# Patient Record
Sex: Female | Born: 1950 | Race: White | Hispanic: No | Smoking: Current every day smoker
Health system: Southern US, Community
[De-identification: ages and names within clinical notes are randomized; demographics above are authoritative.]

## PROBLEM LIST (undated history)

## (undated) DIAGNOSIS — R42 Dizziness and giddiness: Secondary | ICD-10-CM

## (undated) DIAGNOSIS — I714 Abdominal aortic aneurysm, without rupture, unspecified: Secondary | ICD-10-CM

## (undated) DIAGNOSIS — F419 Anxiety disorder, unspecified: Secondary | ICD-10-CM

## (undated) DIAGNOSIS — E785 Hyperlipidemia, unspecified: Secondary | ICD-10-CM

## (undated) DIAGNOSIS — I10 Essential (primary) hypertension: Secondary | ICD-10-CM

## (undated) HISTORY — DX: Essential (primary) hypertension: I10

## (undated) HISTORY — DX: Anxiety disorder, unspecified: F41.9

## (undated) HISTORY — DX: Abdominal aortic aneurysm, without rupture: I71.4

## (undated) HISTORY — DX: Abdominal aortic aneurysm, without rupture, unspecified: I71.40

## (undated) HISTORY — DX: Dizziness and giddiness: R42

## (undated) HISTORY — DX: Hyperlipidemia, unspecified: E78.5

---

## 2000-01-12 ENCOUNTER — Other Ambulatory Visit: Admission: RE | Admit: 2000-01-12 | Discharge: 2000-01-12 | Payer: Self-pay | Admitting: Gynecology

## 2000-01-14 ENCOUNTER — Encounter: Admission: RE | Admit: 2000-01-14 | Discharge: 2000-01-14 | Payer: Self-pay | Admitting: Gynecology

## 2000-01-14 ENCOUNTER — Encounter: Payer: Self-pay | Admitting: Gynecology

## 2000-01-16 ENCOUNTER — Other Ambulatory Visit: Admission: RE | Admit: 2000-01-16 | Discharge: 2000-01-16 | Payer: Self-pay | Admitting: Gynecology

## 2000-01-16 ENCOUNTER — Encounter (INDEPENDENT_AMBULATORY_CARE_PROVIDER_SITE_OTHER): Payer: Self-pay

## 2006-07-15 ENCOUNTER — Other Ambulatory Visit: Admission: RE | Admit: 2006-07-15 | Discharge: 2006-07-15 | Payer: Self-pay | Admitting: Gynecology

## 2006-07-19 ENCOUNTER — Encounter: Admission: RE | Admit: 2006-07-19 | Discharge: 2006-07-19 | Payer: Self-pay | Admitting: Gynecology

## 2007-08-16 ENCOUNTER — Other Ambulatory Visit: Admission: RE | Admit: 2007-08-16 | Discharge: 2007-08-16 | Payer: Self-pay | Admitting: Gynecology

## 2007-08-23 ENCOUNTER — Encounter: Admission: RE | Admit: 2007-08-23 | Discharge: 2007-08-23 | Payer: Self-pay | Admitting: Gynecology

## 2008-08-16 ENCOUNTER — Other Ambulatory Visit: Admission: RE | Admit: 2008-08-16 | Discharge: 2008-08-16 | Payer: Self-pay | Admitting: Gynecology

## 2008-08-23 ENCOUNTER — Encounter: Admission: RE | Admit: 2008-08-23 | Discharge: 2008-08-23 | Payer: Self-pay | Admitting: Gynecology

## 2009-11-12 ENCOUNTER — Encounter: Admission: RE | Admit: 2009-11-12 | Discharge: 2009-11-12 | Payer: Self-pay | Admitting: Gynecology

## 2009-11-19 ENCOUNTER — Encounter: Admission: RE | Admit: 2009-11-19 | Discharge: 2009-11-19 | Payer: Self-pay | Admitting: Gynecology

## 2010-12-28 ENCOUNTER — Encounter: Payer: Self-pay | Admitting: Gynecology

## 2012-02-01 ENCOUNTER — Other Ambulatory Visit: Payer: Self-pay | Admitting: Gynecology

## 2012-02-01 DIAGNOSIS — R928 Other abnormal and inconclusive findings on diagnostic imaging of breast: Secondary | ICD-10-CM

## 2012-03-08 DIAGNOSIS — E785 Hyperlipidemia, unspecified: Secondary | ICD-10-CM | POA: Insufficient documentation

## 2012-03-18 ENCOUNTER — Ambulatory Visit
Admission: RE | Admit: 2012-03-18 | Discharge: 2012-03-18 | Disposition: A | Discharge status: Home / Self Care | Payer: BC Managed Care – PPO | Source: Ambulatory Visit | Attending: Gynecology | Admitting: Gynecology

## 2012-03-18 DIAGNOSIS — R928 Other abnormal and inconclusive findings on diagnostic imaging of breast: Secondary | ICD-10-CM

## 2014-03-07 DIAGNOSIS — R9431 Abnormal electrocardiogram [ECG] [EKG]: Secondary | ICD-10-CM | POA: Insufficient documentation

## 2014-05-01 DIAGNOSIS — D75839 Thrombocytosis, unspecified: Secondary | ICD-10-CM | POA: Insufficient documentation

## 2016-09-02 DIAGNOSIS — K648 Other hemorrhoids: Secondary | ICD-10-CM | POA: Insufficient documentation

## 2020-01-03 ENCOUNTER — Other Ambulatory Visit: Payer: Self-pay | Admitting: Obstetrics and Gynecology

## 2020-01-03 DIAGNOSIS — Z720 Tobacco use: Secondary | ICD-10-CM

## 2020-01-11 ENCOUNTER — Ambulatory Visit
Admission: RE | Admit: 2020-01-11 | Discharge: 2020-01-11 | Disposition: A | Discharge status: Home / Self Care | Payer: Medicare Other | Source: Ambulatory Visit | Attending: Obstetrics and Gynecology | Admitting: Obstetrics and Gynecology

## 2020-01-11 DIAGNOSIS — Z720 Tobacco use: Secondary | ICD-10-CM

## 2020-02-14 ENCOUNTER — Other Ambulatory Visit: Payer: Self-pay | Admitting: Obstetrics and Gynecology

## 2020-02-14 DIAGNOSIS — N281 Cyst of kidney, acquired: Secondary | ICD-10-CM

## 2020-03-11 ENCOUNTER — Ambulatory Visit
Admission: RE | Admit: 2020-03-11 | Discharge: 2020-03-11 | Disposition: A | Discharge status: Home / Self Care | Payer: Medicare Other | Source: Ambulatory Visit | Attending: Obstetrics and Gynecology | Admitting: Obstetrics and Gynecology

## 2020-03-11 DIAGNOSIS — N281 Cyst of kidney, acquired: Secondary | ICD-10-CM

## 2020-03-11 MED ORDER — GADOBENATE DIMEGLUMINE 529 MG/ML IV SOLN
11.0000 mL | Freq: Once | INTRAVENOUS | Status: AC | PRN
  Administered 2020-03-11: 17:00:00 11 mL via INTRAVENOUS

## 2020-04-12 ENCOUNTER — Other Ambulatory Visit: Payer: Self-pay | Admitting: *Deleted

## 2020-04-12 DIAGNOSIS — I714 Abdominal aortic aneurysm, without rupture, unspecified: Secondary | ICD-10-CM

## 2020-04-16 ENCOUNTER — Encounter: Payer: Self-pay | Admitting: *Deleted

## 2020-04-16 ENCOUNTER — Ambulatory Visit: Payer: Medicare Other | Admitting: Vascular Surgery

## 2020-04-16 ENCOUNTER — Encounter: Payer: Self-pay | Admitting: Vascular Surgery

## 2020-04-16 ENCOUNTER — Other Ambulatory Visit: Payer: Self-pay

## 2020-04-16 ENCOUNTER — Ambulatory Visit (HOSPITAL_COMMUNITY)
Admission: RE | Admit: 2020-04-16 | Discharge: 2020-04-16 | Disposition: A | Discharge status: Home / Self Care | Payer: Medicare Other | Source: Ambulatory Visit | Attending: Vascular Surgery | Admitting: Vascular Surgery

## 2020-04-16 VITALS — BP 117/81 | HR 71 | Temp 98.0°F | Resp 20 | Ht 63.5 in | Wt 119.0 lb

## 2020-04-16 DIAGNOSIS — I714 Abdominal aortic aneurysm, without rupture, unspecified: Secondary | ICD-10-CM

## 2020-04-16 DIAGNOSIS — F17200 Nicotine dependence, unspecified, uncomplicated: Secondary | ICD-10-CM | POA: Insufficient documentation

## 2020-04-16 NOTE — Progress Notes (Signed)
[facility]ist of [place]  Patient name: Alice White MRN: [id number] DOB: 1951-08-21 Sex: female  REASON FOR CONSULT: Evaluation of incidental finding of abdominal aortic aneurysm  HPI: Alice White is a 69 y.o. female, who is here today for discussion of abdominal aortic aneurysm.  She had no prior knowledge of this.  She initially underwent a lung cancer low dose CT scan of her chest.  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  Also had a renal cyst and underwent MRI for further evaluation of the left renal cyst.  I have reviewed her CT scan and her MRI images.  She has no history of cardiac disease and no history of peripheral vascular occlusive disease and no history of stroke  Past Medical History:  Diagnosis Date  . AAA (abdominal aortic aneurysm) (HCC)   . Anxiety   . Hyperlipidemia   . Hypertension   . Vertigo     History reviewed. No pertinent family history.  SOCIAL HISTORY: Social History   Socioeconomic History  . Marital status: Married    Spouse name: Not on file  . Number of children: Not on file  . Years of education: Not on file  . Highest education level: Not on file  Occupational History  . Not on file  Tobacco Use  . Smoking status: Current Every Day Smoker    Packs/day: 0.50    Types: Cigarettes  . Smokeless tobacco: Never Used  Substance and Sexual Activity  . Alcohol use: Not Currently  . Drug use: Never  . Sexual activity: Not on file  Other Topics Concern  . Not on file  Social History Narrative  . Not on file   Social Determinants of Health   Financial Resource Strain:   . Difficulty of Paying Living Expenses:   Food Insecurity:   . Worried About Programme researcher, broadcasting/film/video in the Last Year:   . Barista in the Last Year:   Transportation Needs:   . Freight forwarder (Medical):   Marland Kitchen Lack of Transportation (Non-Medical):   Physical Activity:   . Days of Exercise per  Week:   . Minutes of Exercise per Session:   Stress:   . Feeling of Stress :   Social Connections:   . Frequency of Communication with Friends and Family:   . Frequency of Social Gatherings with Friends and Family:   . Attends Religious Services:   . Active Member of Clubs or Organizations:   . Attends Banker Meetings:   Marland Kitchen Marital Status:   Intimate Partner Violence:   . Fear of Current or Ex-Partner:   . Emotionally Abused:   Marland Kitchen Physically Abused:   . Sexually Abused:     No Known Allergies  Current Outpatient Medications  Medication Sig Dispense Refill  . ALPRAZolam (XANAX) 0.25 MG tablet Take 0.25 mg by mouth daily as needed.    Marland Kitchen amLODipine (NORVASC) 5 MG tablet amlodipine 5 mg tablet    . aspirin 81 MG chewable tablet Chew by mouth.    . Calcium-Magnesium-Vitamin D (CALCIUM 1200+D3 PO)     . fenofibrate 160 MG tablet     . hydrochlorothiazide (HYDRODIURIL) 25 MG tablet Take 25 mg by mouth daily.    . meclizine (ANTIVERT) 50 MG tablet Take by mouth.    . Multiple Vitamins-Minerals (MULTIVITAMIN WITH MINERALS) tablet Take by mouth.    . Omega-3 Fatty Acids (FISH OIL) 1000 MG CAPS Take by  mouth.    . simvastatin (ZOCOR) 20 MG tablet Take 20 mg by mouth at bedtime.     No current facility-administered medications for this visit.    REVIEW OF SYSTEMS:  [X]  denotes positive finding, [ ]  denotes negative finding Cardiac  Comments:  Chest pain or chest pressure:    Shortness of breath upon exertion:    Short of breath when lying flat:    Irregular heart rhythm:        Vascular    Pain in calf, thigh, or hip brought on by ambulation:    Pain in feet at night that wakes you up from your sleep:     Blood clot in your veins:    Leg swelling:         Pulmonary    Oxygen at home:    Productive cough:     Wheezing:         Neurologic    Sudden weakness in arms or legs:     Sudden numbness in arms or legs:     Sudden onset of difficulty speaking or slurred  speech:    Temporary loss of vision in one eye:     Problems with dizziness:         Gastrointestinal    Blood in stool:     Vomited blood:         Genitourinary    Burning when urinating:     Blood in urine:        Psychiatric    Major depression:         Hematologic    Bleeding problems:    Problems with blood clotting too easily:        Skin    Rashes or ulcers:        Constitutional    Fever or chills:      PHYSICAL EXAM: Vitals:   04/16/20 0828  BP: 117/81  Pulse: 71  Resp: 20  Temp: 98 F (36.7 C)  SpO2: 96%  Weight: 119 lb (54 kg)  Height: 5' 3.5" (1.613 m)    GENERAL: The patient is a well-nourished female, in no acute distress. The vital signs are documented above. CARDIOVASCULAR: Carotid arteries are without bruits bilaterally.  She has 2+ radial 2+ femoral 2+ popliteal and 2+ dorsalis pedis pulses with no evidence of peripheral artery aneurysm PULMONARY: There is good air exchange  ABDOMEN: Soft and non-tender.  She does have a prominent aortic pulse MUSCULOSKELETAL: There are no major deformities or cyanosis. NEUROLOGIC: No focal weakness or paresthesias are detected. SKIN: There are no ulcers or rashes noted. PSYCHIATRIC: The patient has a normal affect.  DATA:  Her chest CT and abdominal MRI were reviewed.  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  She also has a saccular component of the aneurysm in her mid abdominal aorta.  MEDICAL ISSUES: I had a long discussion with the patient regarding this.  Explained the significance of her location this is not typical location since it does involve the renal arteries.  Also explained the significance of her saccular aneurysm.  I did discuss leaking aneurysm symptoms and she knows to report immediately to the emergency room should this occur.  We will see her in 6 months with CT angiogram of her abdomen and pelvis.   Rosetta Posner, MD FACS [facility] of  [facility] Tel [phone] Pager [phone]

## 2020-11-14 ENCOUNTER — Other Ambulatory Visit: Payer: Self-pay

## 2020-11-14 DIAGNOSIS — I714 Abdominal aortic aneurysm, without rupture, unspecified: Secondary | ICD-10-CM

## 2020-12-25 ENCOUNTER — Ambulatory Visit
Admission: RE | Admit: 2020-12-25 | Discharge: 2020-12-25 | Disposition: A | Discharge status: Home / Self Care | Payer: Medicare Other | Source: Ambulatory Visit | Attending: Vascular Surgery | Admitting: Vascular Surgery

## 2020-12-25 ENCOUNTER — Other Ambulatory Visit: Payer: Self-pay

## 2020-12-25 DIAGNOSIS — I714 Abdominal aortic aneurysm, without rupture, unspecified: Secondary | ICD-10-CM

## 2020-12-25 MED ORDER — IOPAMIDOL (ISOVUE-370) INJECTION 76%
75.0000 mL | Freq: Once | INTRAVENOUS | Status: AC | PRN
  Administered 2020-12-25: 75 mL via INTRAVENOUS

## 2020-12-31 ENCOUNTER — Ambulatory Visit: Payer: Medicare Other | Admitting: Vascular Surgery

## 2020-12-31 ENCOUNTER — Other Ambulatory Visit: Payer: Self-pay

## 2020-12-31 ENCOUNTER — Encounter: Payer: Self-pay | Admitting: Vascular Surgery

## 2020-12-31 VITALS — BP 125/84 | HR 89 | Temp 98.1°F | Wt 122.0 lb

## 2020-12-31 DIAGNOSIS — I714 Abdominal aortic aneurysm, without rupture, unspecified: Secondary | ICD-10-CM

## 2020-12-31 NOTE — Progress Notes (Signed)
Vascular and Vein Specialist of [place]  Patient name: Alice White MRN: [id number] DOB: Jan 01, 1951 Sex: female  REASON FOR CONSULT: Evaluation of incidental finding of abdominal aortic aneurysm  HPI: Alice White is a 70 y.o. female, who is here today for discussion of abdominal aortic aneurysm.  She had no prior knowledge of this.  She initially underwent a lung cancer low dose CT scan of her chest.  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  Also had a renal cyst and underwent MRI for further evaluation of the left renal cyst.  I have reviewed her CT scan and her MRI images.  She has no history of cardiac disease and no history of peripheral vascular occlusive disease and no history of stroke  12/31/20: Patient returns to clinic with CTA to discuss.  She has no symptoms.  Past Medical History:  Diagnosis Date  . AAA (abdominal aortic aneurysm) (HCC)   . Anxiety   . Hyperlipidemia   . Hypertension   . Vertigo     No family history on file.  SOCIAL HISTORY: Social History   Socioeconomic History  . Marital status: Married    Spouse name: Not on file  . Number of children: Not on file  . Years of education: Not on file  . Highest education level: Not on file  Occupational History  . Not on file  Tobacco Use  . Smoking status: Current Every Day Smoker    Packs/day: 0.50    Types: Cigarettes  . Smokeless tobacco: Never Used  Vaping Use  . Vaping Use: Never used  Substance and Sexual Activity  . Alcohol use: Not Currently  . Drug use: Never  . Sexual activity: Not on file  Other Topics Concern  . Not on file  Social History Narrative  . Not on file   Social Determinants of Health   Financial Resource Strain: Not on file  Food Insecurity: Not on file  Transportation Needs: Not on file  Physical Activity: Not on file  Stress: Not on file  Social Connections: Not on file  Intimate Partner Violence: Not on file     No Known Allergies  Current Outpatient Medications  Medication Sig Dispense Refill  . ALPRAZolam (XANAX) 0.25 MG tablet Take 0.25 mg by mouth daily as needed.    Marland Kitchen amLODipine (NORVASC) 5 MG tablet amlodipine 5 mg tablet    . aspirin 81 MG chewable tablet Chew by mouth.    . Calcium-Magnesium-Vitamin D (CALCIUM 1200+D3 PO)     . fenofibrate 160 MG tablet     . hydrochlorothiazide (HYDRODIURIL) 25 MG tablet Take 25 mg by mouth daily.    . meclizine (ANTIVERT) 50 MG tablet Take by mouth.    . Multiple Vitamins-Minerals (MULTIVITAMIN WITH MINERALS) tablet Take by mouth.    . Omega-3 Fatty Acids (FISH OIL) 1000 MG CAPS Take by mouth.    . simvastatin (ZOCOR) 20 MG tablet Take 20 mg by mouth at bedtime.     No current facility-administered medications for this visit.    REVIEW OF SYSTEMS:  [X]  denotes positive finding, [ ]  denotes negative finding Cardiac  Comments:  Chest pain or chest pressure:    Shortness of breath upon exertion:    Short of breath when lying flat:    Irregular heart rhythm:        Vascular    Pain in calf, thigh, or hip brought on by ambulation:    Pain in feet at  night that wakes you up from your sleep:     Blood clot in your veins:    Leg swelling:         Pulmonary    Oxygen at home:    Productive cough:     Wheezing:         Neurologic    Sudden weakness in arms or legs:     Sudden numbness in arms or legs:     Sudden onset of difficulty speaking or slurred speech:    Temporary loss of vision in one eye:     Problems with dizziness:         Gastrointestinal    Blood in stool:     Vomited blood:         Genitourinary    Burning when urinating:     Blood in urine:        Psychiatric    Major depression:         Hematologic    Bleeding problems:    Problems with blood clotting too easily:        Skin    Rashes or ulcers:        Constitutional    Fever or chills:      PHYSICAL EXAM: Vitals:   12/31/20 1119  BP: 125/84  Pulse:  89  Temp: 98.1 F (36.7 C)  TempSrc: Skin  SpO2: 100%  Weight: 122 lb (55.3 kg)    GENERAL: The patient is a well-nourished female, in no acute distress. The vital signs are documented above. CARDIOVASCULAR: Carotid arteries are without bruits bilaterally.  She has 2+ radial 2+ femoral 2+ popliteal and 2+ dorsalis pedis pulses with no evidence of peripheral artery aneurysm PULMONARY: There is good air exchange  ABDOMEN: Soft and non-tender.  She does have a prominent aortic pulse MUSCULOSKELETAL: There are no major deformities or cyanosis. NEUROLOGIC: No focal weakness or paresthesias are detected. SKIN: There are no ulcers or rashes noted. PSYCHIATRIC: The patient has a normal affect.  DATA:  CTA shows 36mm paravisceral aortic aneurysm. 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 Open repair would also be challenging but feasible in our facility She is much more interested in endovascular repair than open repair We will plan to see her again in a year with repeat CTA Once she reaches the threshold of 50 mm for intervention, I will plan to refer her to Dr. Bennie Pierini at [facility] to discuss endovascular repair.  Rande Brunt. Lenell Antu, MD [facility] Specialists of [facility] Phone Number: [phone] 12/31/2020 12:00 PM

## 2021-02-20 ENCOUNTER — Other Ambulatory Visit: Payer: Self-pay | Admitting: Obstetrics and Gynecology

## 2021-02-20 DIAGNOSIS — Z720 Tobacco use: Secondary | ICD-10-CM

## 2021-03-07 ENCOUNTER — Ambulatory Visit
Admission: RE | Admit: 2021-03-07 | Discharge: 2021-03-07 | Disposition: A | Discharge status: Home / Self Care | Payer: Medicare Other | Source: Ambulatory Visit | Attending: Obstetrics and Gynecology | Admitting: Obstetrics and Gynecology

## 2021-03-07 DIAGNOSIS — Z720 Tobacco use: Secondary | ICD-10-CM

## 2021-04-27 IMAGING — CT CT CHEST LUNG CANCER SCREENING LOW DOSE W/O CM
2 of 5 series · 15 of 40 positions shown, 18 images · non-contrast
Comparison: None.

CLINICAL DATA: 68-year-old female with 40 pack-year history of
smoking. Lung cancer screening.

EXAM:
CT CHEST WITHOUT CONTRAST LOW-DOSE FOR LUNG CANCER SCREENING
TECHNIQUE: Multidetector CT imaging of the chest was performed following the
standard protocol without IV contrast.

[Series 4: lung 1.00 br44 cor · coronal · 0.65mm/px · 3 of 251 slices shown]
[im 51/251  lung]
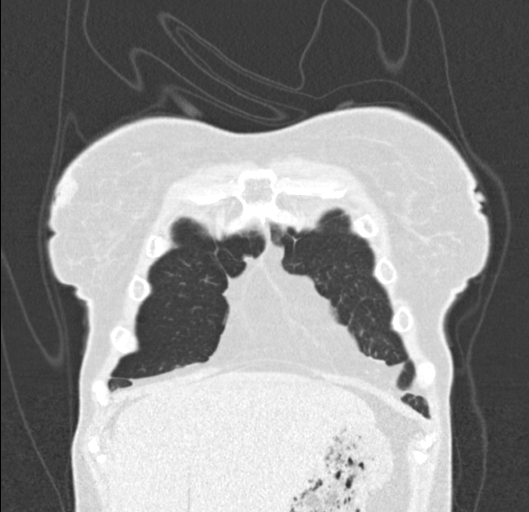
[im 101/251  lung]
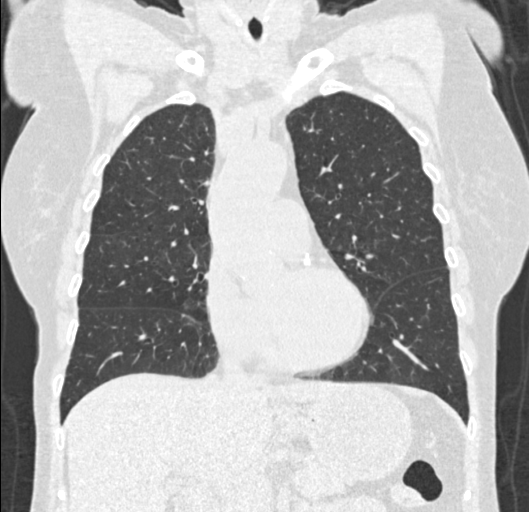
[im 151/251  lung]
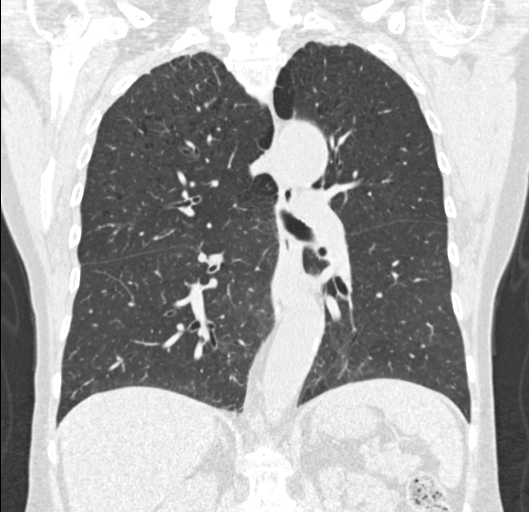

[Series 9: lung 1.00 br60 axial · axial · 0.67mm/px · z∈[-1155,-854]mm · 12 of 333 slices shown, 15 images]
[im 16/333  mediastinal]
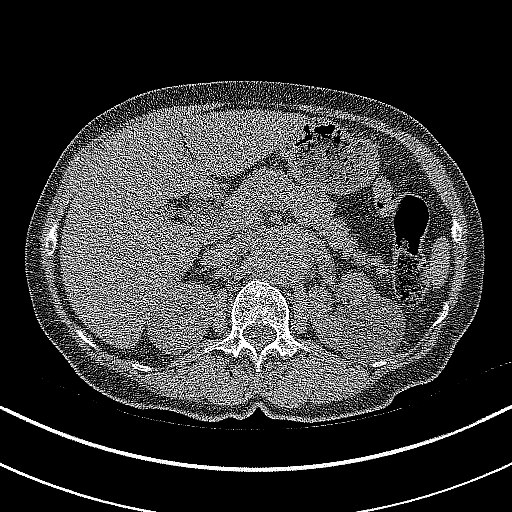
[im 16/333  lung]
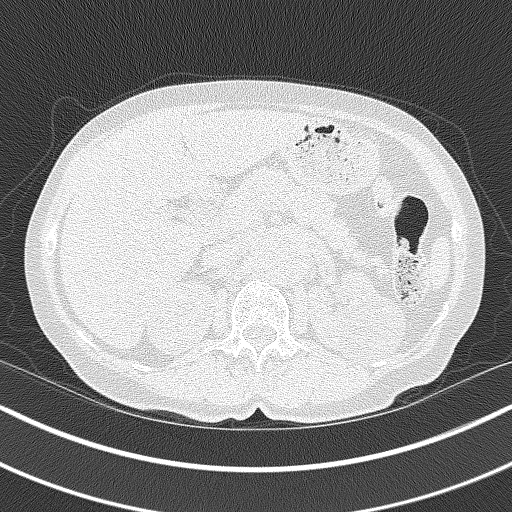
[im 48/333  lung]
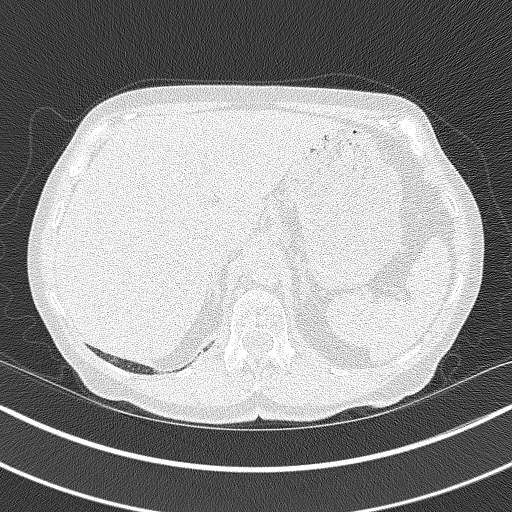
[im 80/333  lung]
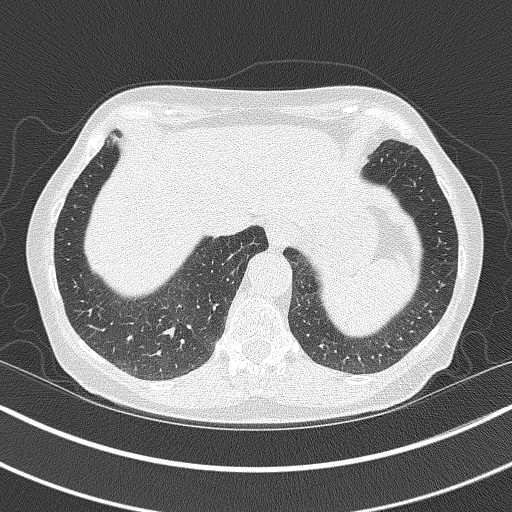
[im 95/333  lung]
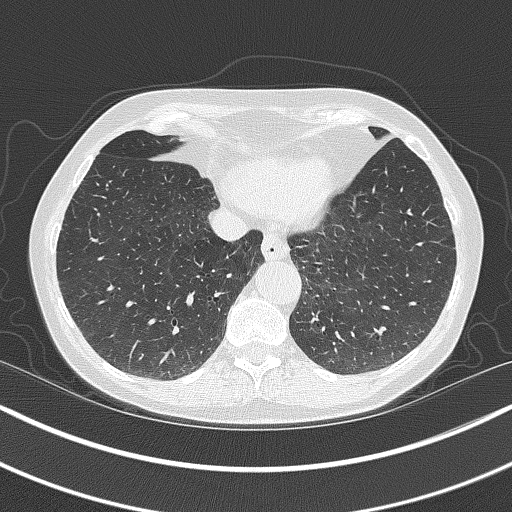
[im 127/333  mediastinal]
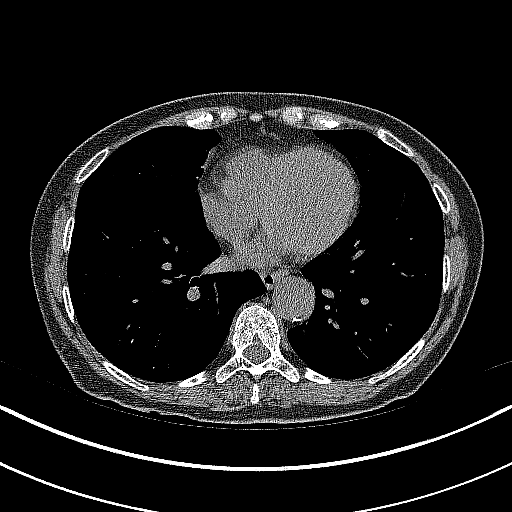
[im 127/333  lung]
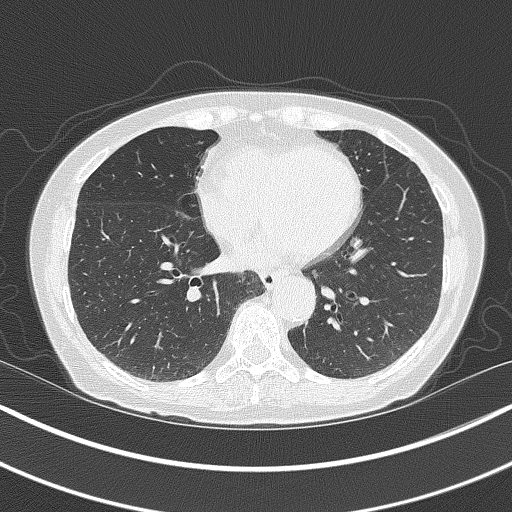
[im 159/333  lung]
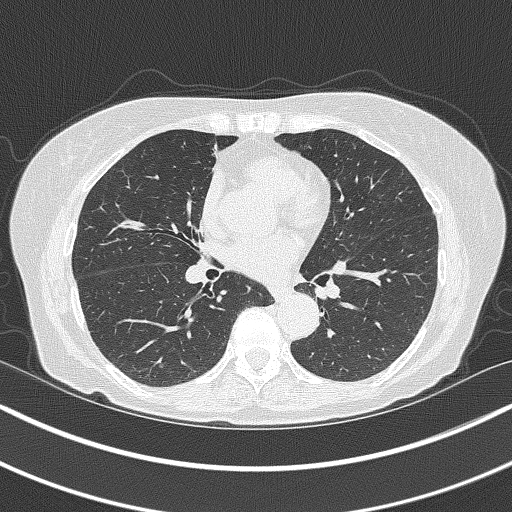
[im 174/333  lung]
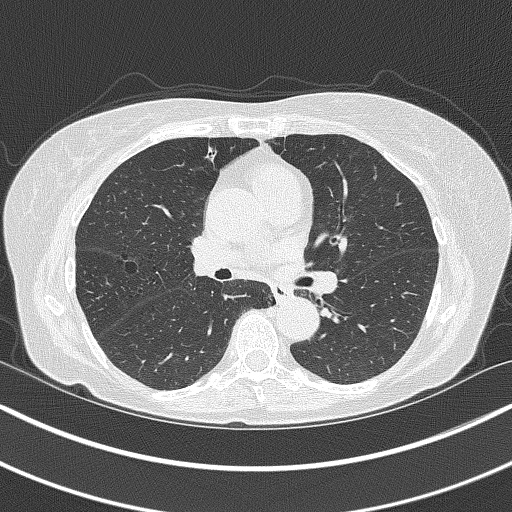
[im 206/333  lung]
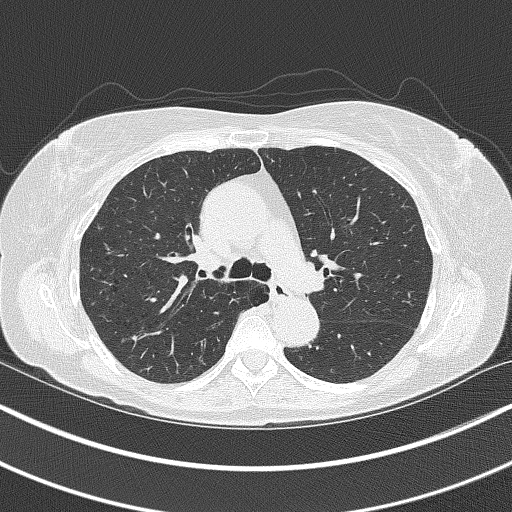
[im 238/333  mediastinal]
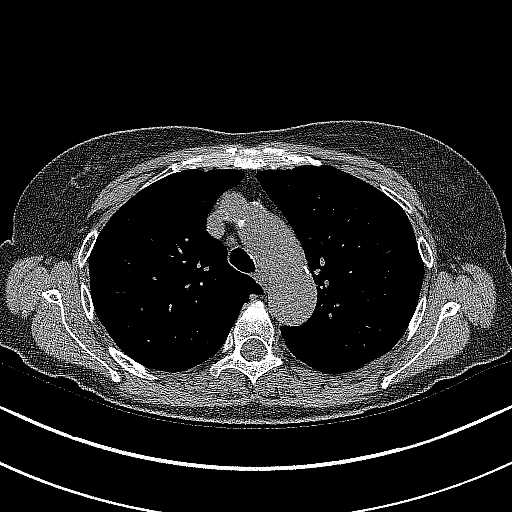
[im 238/333  lung]
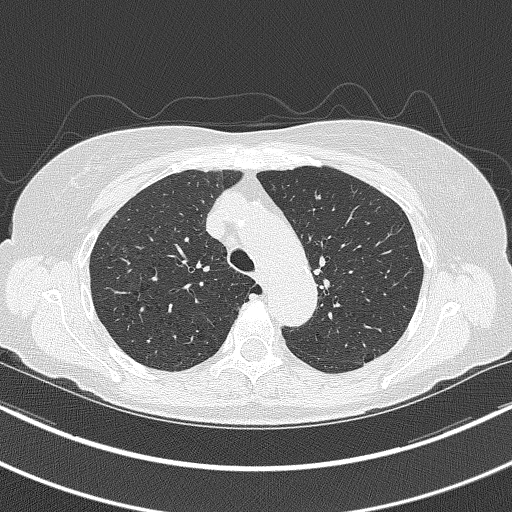
[im 253/333  lung]
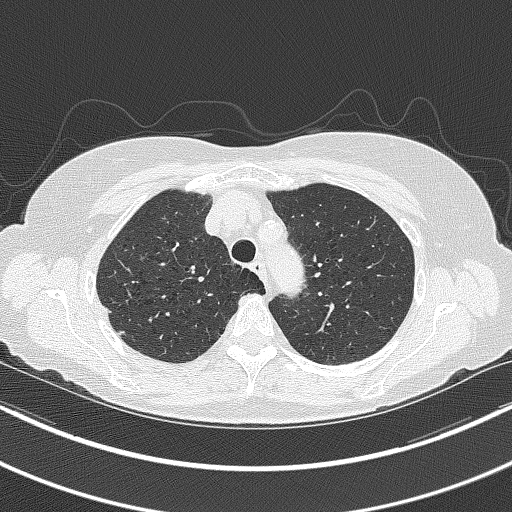
[im 285/333  lung]
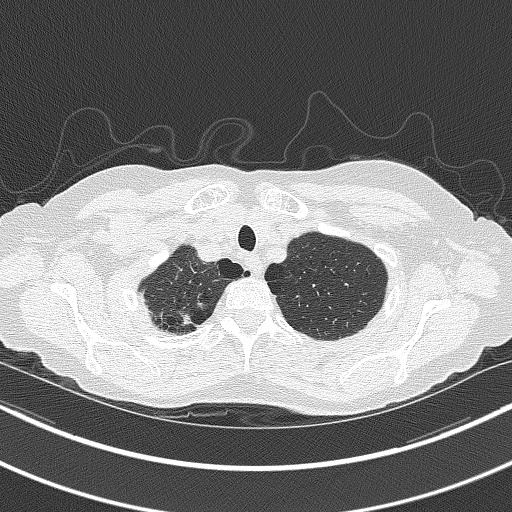
[im 317/333  lung]
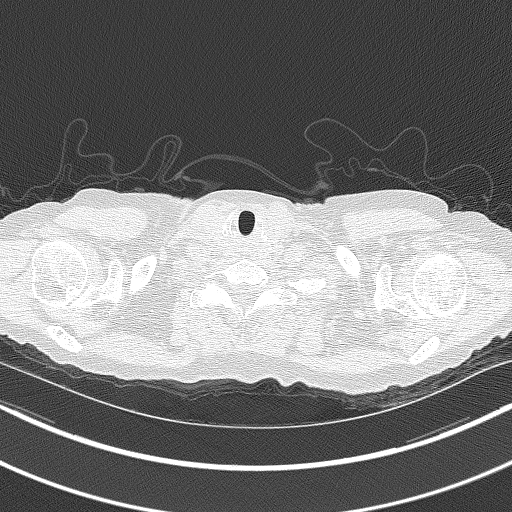

[15 of 40 positions shown; findings below may reference images not displayed]

FINDINGS: Cardiovascular: The heart size is normal. No substantial pericardial
effusion. Coronary artery calcification is evident. Atherosclerotic
calcification is noted in the wall of the thoracic aorta.

Mediastinum/Nodes: No mediastinal lymphadenopathy. No evidence for
gross hilar lymphadenopathy although assessment is limited by the
lack of intravenous contrast on today's study. The esophagus has
normal imaging features. There is no axillary lymphadenopathy.

Lungs/Pleura: Centrilobular and paraseptal emphysema evident.
Biapical pleuroparenchymal scarring noted. Scattered areas of
bronchiectasis and bronchial wall thickening noted bilaterally.
Peripheral small airway impaction noted in multiple areas of both
lungs. Tree-in-bud nodularity noted posterior right upper lobe with
nodules measuring up to maximum volume derived equivalent diameter
of 3.8 mm. Several scattered calcified granulomata noted.
Atelectasis or scarring noted in the right middle lobe and lingula.

Upper Abdomen: 4.9 cm homogeneous low-density lesion in the
interpolar left kidney has been incompletely characterized but is
likely a cyst. A second incompletely visualized left renal lesion
measures 13 mm with attenuation higher than would be expected for a
simple cyst. Both of these lesions are present on the abdomen CT
scan from 01/26/2011 but have progressed in the interval. Juxtarenal
abdominal aorta measures up to 4.2 cm diameter but entire extent of
aneurysmal dilatation has not been visualized. This is progressive
from 2.4 cm diameter since prior study.

Musculoskeletal: No worrisome lytic or sclerotic osseous
abnormality.
IMPRESSION: 1. Lung-RADS 2, benign appearance or behavior. Continue annual
screening with low-dose chest CT without contrast in 12 months.
2. Scattered areas of tree-in-bud nodularity bilaterally, most
prominently in the posterior right upper lobe. Imaging features are
compatible with atypical infection (including SIMAH).
3. 4.9 cm probable cyst in the interpolar left kidney with a 13 mm
lesion having attenuation too high to be a simple cyst. While likely
a cyst complicated by proteinaceous debris or hemorrhage, follow-up
CT or MRI with and without contrast could be used to further
evaluate.
4. Juxtarenal abdominal aorta measures up to at least 4.2 cm
diameter. Entire extent of the aneurysmal dilatation has not been
visualized on this chest CT and features are progressive when
comparing back to 01/26/2011. This could be further assessed at at
the time of follow-up recommended immediately above.
5.  Emphysema (3PMHQ-9FB.8) and Aortic Atherosclerosis (3PMHQ-170.0)

## 2022-04-07 ENCOUNTER — Other Ambulatory Visit: Payer: Self-pay | Admitting: Nurse Practitioner

## 2022-04-07 DIAGNOSIS — Z139 Encounter for screening, unspecified: Secondary | ICD-10-CM

## 2022-04-28 ENCOUNTER — Ambulatory Visit: Payer: Medicare Other | Admitting: Vascular Surgery

## 2022-05-05 ENCOUNTER — Ambulatory Visit
Admission: RE | Admit: 2022-05-05 | Discharge: 2022-05-05 | Disposition: A | Discharge status: Home / Self Care | Payer: Medicare Other | Source: Ambulatory Visit | Attending: Nurse Practitioner | Admitting: Nurse Practitioner

## 2022-05-05 DIAGNOSIS — Z139 Encounter for screening, unspecified: Secondary | ICD-10-CM

## 2022-05-15 ENCOUNTER — Other Ambulatory Visit: Payer: Self-pay

## 2022-05-15 DIAGNOSIS — I714 Abdominal aortic aneurysm, without rupture, unspecified: Secondary | ICD-10-CM

## 2022-05-27 ENCOUNTER — Ambulatory Visit (HOSPITAL_COMMUNITY)
Admission: RE | Admit: 2022-05-27 | Discharge: 2022-05-27 | Disposition: A | Discharge status: Home / Self Care | Payer: Medicare Other | Source: Ambulatory Visit | Attending: Vascular Surgery | Admitting: Vascular Surgery

## 2022-05-27 DIAGNOSIS — I714 Abdominal aortic aneurysm, without rupture, unspecified: Secondary | ICD-10-CM | POA: Insufficient documentation

## 2022-05-27 MED ORDER — IOHEXOL 350 MG/ML SOLN
100.0000 mL | Freq: Once | INTRAVENOUS | Status: AC | PRN
  Administered 2022-05-27: 100 mL via INTRAVENOUS

## 2022-06-02 ENCOUNTER — Encounter: Payer: Self-pay | Admitting: Vascular Surgery

## 2022-06-02 ENCOUNTER — Ambulatory Visit: Payer: Medicare Other | Admitting: Vascular Surgery

## 2022-06-02 VITALS — BP 119/78 | HR 67 | Temp 98.4°F | Resp 20 | Ht 63.5 in | Wt 111.6 lb

## 2022-06-02 DIAGNOSIS — I7162 Paravisceral aneurysm of the thoracoabdominal aorta, without rupture: Secondary | ICD-10-CM

## 2023-04-08 ENCOUNTER — Other Ambulatory Visit (HOSPITAL_BASED_OUTPATIENT_CLINIC_OR_DEPARTMENT_OTHER): Payer: Self-pay | Admitting: Obstetrics and Gynecology

## 2023-04-08 DIAGNOSIS — I700 Atherosclerosis of aorta: Secondary | ICD-10-CM

## 2023-05-19 ENCOUNTER — Ambulatory Visit (HOSPITAL_BASED_OUTPATIENT_CLINIC_OR_DEPARTMENT_OTHER)
Admission: RE | Admit: 2023-05-19 | Discharge: 2023-05-19 | Disposition: A | Discharge status: Home / Self Care | Payer: Medicare Other | Source: Ambulatory Visit | Attending: Obstetrics and Gynecology | Admitting: Obstetrics and Gynecology

## 2023-05-19 DIAGNOSIS — I700 Atherosclerosis of aorta: Secondary | ICD-10-CM | POA: Insufficient documentation

## 2023-05-27 ENCOUNTER — Other Ambulatory Visit: Payer: Self-pay | Admitting: *Deleted

## 2023-05-27 DIAGNOSIS — I714 Abdominal aortic aneurysm, without rupture, unspecified: Secondary | ICD-10-CM

## 2023-06-07 NOTE — Progress Notes (Unsigned)
Vascular and Vein Specialist of [place]  Patient name: Alice White MRN: [id number] DOB: 04-Jul-1951 Sex: female  REASON FOR CONSULT: Evaluation of incidental finding of abdominal aortic aneurysm  HPI: Alice White is a 72 y.o. female, who is here today for discussion of abdominal aortic aneurysm.  She had no prior knowledge of this.  She initially underwent a lung cancer low dose CT scan of her chest.  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  Also had a renal cyst and underwent MRI for further evaluation of the left renal cyst.  I have reviewed her CT scan and her MRI images.  She has no history of cardiac disease and no history of peripheral vascular occlusive disease and no history of stroke  12/31/20: Patient returns to clinic with CTA to discuss.  She has no symptoms.  06/02/22: Returns for surveillance.  She is doing well overall.  No symptoms attributable to her aneurysm.  We again reviewed the unique anatomy of her aortic aneurysm.  Past Medical History:  Diagnosis Date   AAA (abdominal aortic aneurysm) (HCC)    Anxiety    Hyperlipidemia    Hypertension    Vertigo     No family history on file.  SOCIAL HISTORY: Social History   Socioeconomic History   Marital status: Married    Spouse name: Not on file   Number of children: Not on file   Years of education: Not on file   Highest education level: Not on file  Occupational History   Not on file  Tobacco Use   Smoking status: Every Day    Packs/day: .5    Types: Cigarettes    Passive exposure: Never   Smokeless tobacco: Never  Vaping Use   Vaping Use: Never used  Substance and Sexual Activity   Alcohol use: Not Currently   Drug use: Never   Sexual activity: Not on file  Other Topics Concern   Not on file  Social History Narrative   Not on file   Social Determinants of Health   Financial Resource Strain: Not on file  Food Insecurity: Not on file   Transportation Needs: Not on file  Physical Activity: Not on file  Stress: Not on file  Social Connections: Not on file  Intimate Partner Violence: Not on file    No Known Allergies  Current Outpatient Medications  Medication Sig Dispense Refill   ALPRAZolam (XANAX) 0.25 MG tablet Take 0.25 mg by mouth daily as needed.     amLODipine (NORVASC) 5 MG tablet amlodipine 5 mg tablet     aspirin 81 MG chewable tablet Chew by mouth.     Calcium-Magnesium-Vitamin D (CALCIUM 1200+D3 PO)      fenofibrate 160 MG tablet      hydrochlorothiazide (HYDRODIURIL) 25 MG tablet Take 25 mg by mouth daily.     ibandronate (BONIVA) 150 MG tablet Take 150 mg by mouth every 30 (thirty) days.     meclizine (ANTIVERT) 50 MG tablet Take by mouth.     Multiple Vitamins-Minerals (MULTIVITAMIN WITH MINERALS) tablet Take by mouth.     Omega-3 Fatty Acids (FISH OIL) 1000 MG CAPS Take by mouth.     simvastatin (ZOCOR) 20 MG tablet Take 20 mg by mouth at bedtime.     No current facility-administered medications for this visit.    REVIEW OF SYSTEMS:  [X]  denotes positive finding, [ ]  denotes negative finding Cardiac  Comments:  Chest pain or chest pressure:  Shortness of breath upon exertion:    Short of breath when lying flat:    Irregular heart rhythm:        Vascular    Pain in calf, thigh, or hip brought on by ambulation:    Pain in feet at night that wakes you up from your sleep:     Blood clot in your veins:    Leg swelling:         Pulmonary    Oxygen at home:    Productive cough:     Wheezing:         Neurologic    Sudden weakness in arms or legs:     Sudden numbness in arms or legs:     Sudden onset of difficulty speaking or slurred speech:    Temporary loss of vision in one eye:     Problems with dizziness:         Gastrointestinal    Blood in stool:     Vomited blood:         Genitourinary    Burning when urinating:     Blood in urine:        Psychiatric    Major depression:          Hematologic    Bleeding problems:    Problems with blood clotting too easily:        Skin    Rashes or ulcers:        Constitutional    Fever or chills:      PHYSICAL EXAM: There were no vitals filed for this visit.   GENERAL: The patient is a well-nourished female, in no acute distress. The vital signs are documented above. CARDIOVASCULAR: Carotid arteries are without bruits bilaterally.  She has 2+ radial 2+ femoral 2+ popliteal and 2+ dorsalis pedis pulses with no evidence of peripheral artery aneurysm PULMONARY: There is good air exchange  ABDOMEN: Soft and non-tender.  She does have a prominent aortic pulse MUSCULOSKELETAL: There are no major deformities or cyanosis. NEUROLOGIC: No focal weakness or paresthesias are detected. SKIN: There are no ulcers or rashes noted. PSYCHIATRIC: The patient has a normal affect.  DATA:  CTA shows 39mm paravisceral aortic aneurysm. 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 Open repair would also be challenging but feasible in our facility She is much more interested in endovascular repair than open repair We will plan to see her again in a year with duplex of the aorta. Once she reaches the threshold of 50 mm for intervention, I will plan to refer her to Dr. Bennie Pierini at [facility] to discuss endovascular repair.  Rande Brunt. Lenell Antu, MD [facility] of [facility] Phone Number: [phone] 06/07/2023 9:28 AM

## 2023-06-08 ENCOUNTER — Ambulatory Visit: Payer: Medicare Other | Admitting: Vascular Surgery

## 2023-06-08 ENCOUNTER — Encounter: Payer: Self-pay | Admitting: Vascular Surgery

## 2023-06-08 ENCOUNTER — Ambulatory Visit (HOSPITAL_COMMUNITY)
Admission: RE | Admit: 2023-06-08 | Discharge: 2023-06-08 | Disposition: A | Discharge status: Home / Self Care | Payer: Medicare Other | Source: Ambulatory Visit | Attending: Vascular Surgery | Admitting: Vascular Surgery

## 2023-06-08 VITALS — BP 129/80 | HR 64 | Temp 97.9°F | Resp 20 | Ht 63.5 in | Wt 104.0 lb

## 2023-06-08 DIAGNOSIS — I7162 Paravisceral aneurysm of the thoracoabdominal aorta, without rupture: Secondary | ICD-10-CM | POA: Diagnosis not present

## 2023-06-08 DIAGNOSIS — I714 Abdominal aortic aneurysm, without rupture, unspecified: Secondary | ICD-10-CM

## 2023-06-21 ENCOUNTER — Other Ambulatory Visit: Payer: Self-pay

## 2023-06-21 DIAGNOSIS — I714 Abdominal aortic aneurysm, without rupture, unspecified: Secondary | ICD-10-CM

## 2023-07-08 ENCOUNTER — Encounter: Payer: Self-pay | Admitting: Emergency Medicine

## 2023-07-08 ENCOUNTER — Ambulatory Visit: Payer: Medicare Other | Admitting: Emergency Medicine

## 2023-07-08 VITALS — BP 126/64 | HR 66 | Ht 63.5 in | Wt 105.0 lb

## 2023-07-08 DIAGNOSIS — R9389 Abnormal findings on diagnostic imaging of other specified body structures: Secondary | ICD-10-CM | POA: Diagnosis not present

## 2023-07-08 DIAGNOSIS — F17210 Nicotine dependence, cigarettes, uncomplicated: Secondary | ICD-10-CM

## 2023-07-08 NOTE — Assessment & Plan Note (Signed)
Discussed cessation with her.  She is not ready to set a quit date at this time.  She likely does have COPD.  We discussed this today.  We we will hold off on PFT, BD at this time since she is asymptomatic.  Main focus needs to be tobacco cessation at this time.

## 2023-07-08 NOTE — Progress Notes (Signed)
Subjective:    Patient ID: Alice White, female    DOB: 06/05/1951, 72 y.o.   MRN: [id number]  HPI 72 year old woman with history of tobacco use (42 pack years pack years), AAA, hypertension, hyperlipidemia, anxiety.  She has participated in the lung cancer screening program with her last scan in 04/2022 that showed some biapical pleural-parenchymal scar, emphysematous change, scattered pulmonary nodules largest 4.6 mm with some calcified granulomas.  She had a cardiac scoring CT chest 05/19/2023 and is here to review that study.  She has not had pulmonary function testing and is not on BD therapy Today she reports that she coughs in the am, does clear some mucous form her throat in am. She does not cough otherwise. She has some breathing limitation, some DOE with hills, able to walk on flat. Able to do her chores. Smokes about 0.5 pk/day.   Cardiac calcium scoring CT chest 05/19/2023 reviewed by me shows no mediastinal or hilar adenopathy, a small hiatal hernia, mild emphysematous change.  There is an area of chronic medial right middle lobe atelectasis, no change in her previous identified pulmonary nodules and calcified granulomas.  Note was made of nodular areas in the trachea and central bronchi, question retained secretions.  In retrospect there have been some transient similar endobronchial nodular areas on her previous lung cancer screening CTs  (2022, 2021).   Review of Systems As per HPI  Past Medical History:  Diagnosis Date   AAA (abdominal aortic aneurysm) (HCC)    Anxiety    Hyperlipidemia    Hypertension    Vertigo      No family history on file.   Social History   Socioeconomic History   Marital status: Married    Spouse name: Not on file   Number of children: Not on file   Years of education: Not on file   Highest education level: Not on file  Occupational History   Not on file  Tobacco Use   Smoking status: Every Day    Current packs/day: 0.50    Types:  Cigarettes    Passive exposure: Never   Smokeless tobacco: Never  Vaping Use   Vaping status: Never Used  Substance and Sexual Activity   Alcohol use: Not Currently   Drug use: Never   Sexual activity: Not on file  Other Topics Concern   Not on file  Social History Narrative   Not on file   Social Determinants of Health   Financial Resource Strain: Low Risk  (03/01/2022)   Received from [facility]   Overall Financial Resource Strain (CARDIA)    Difficulty of Paying Living Expenses: Not hard at all  Food Insecurity: No Food Insecurity (03/01/2022)   Received from [facility]   Hunger Vital Sign    Worried About Running Out of Food in the Last Year: Never true    Ran Out of Food in the Last Year: Never true  Transportation Needs: No Transportation Needs (02/26/2021)   Received from [facility] - Transportation    Lack of Transportation (Medical): No    Lack of Transportation (Non-Medical): No  Physical Activity: Insufficiently Active (03/01/2022)   Received from [facility]   Exercise Vital Sign    Days of Exercise per Week: 3 days    Minutes of Exercise per Session: 20 min  Stress: No Stress Concern Present (03/01/2022)   Received from [facility] of Occupational Health - Occupational Stress Questionnaire  Feeling of Stress : Only a little  Social Connections: Unknown (03/04/2023)   Received from [facility]   Social Network    Social Network: Not on file  Intimate Partner Violence: Unknown (03/04/2023)   Received from [facility]   HITS    Physically Hurt: Not on file    Insult or Talk Down To: Not on file    Threaten Physical Harm: Not on file    Scream or Curse: Not on file    From [place], then [employer] work Never birds No TB exposure    No Known Allergies   Outpatient Medications Prior to Visit  Medication Sig Dispense Refill   ALPRAZolam (XANAX) 0.25 MG tablet Take 0.25 mg by mouth daily as needed.      amLODipine (NORVASC) 5 MG tablet amlodipine 5 mg tablet     aspirin 81 MG chewable tablet Chew by mouth.     fenofibrate 160 MG tablet      hydrochlorothiazide (HYDRODIURIL) 25 MG tablet Take 25 mg by mouth daily.     ibandronate (BONIVA) 150 MG tablet Take 150 mg by mouth every 30 (thirty) days.     meclizine (ANTIVERT) 50 MG tablet Take by mouth.     Multiple Vitamins-Minerals (MULTIVITAMIN WITH MINERALS) tablet Take by mouth.     Omega-3 Fatty Acids (FISH OIL) 1000 MG CAPS Take by mouth.     simvastatin (ZOCOR) 20 MG tablet Take 20 mg by mouth at bedtime.     Calcium-Magnesium-Vitamin D (CALCIUM 1200+D3 PO)      No facility-administered medications prior to visit.        Objective:   Physical Exam  Vitals:   07/08/23 1500  BP: 126/64  Pulse: 66  SpO2: 99%  Weight: 105 lb (47.6 kg)  Height: 5' 3.5" (1.613 m)   Gen: Pleasant, thin, in no distress,  normal affect  ENT: No lesions,  mouth clear,  oropharynx clear, no postnasal drip  Neck: No JVD, no stridor  Lungs: No use of accessory muscles, no crackles or wheezing on normal respiration, no wheeze on forced expiration  Cardiovascular: RRR, heart sounds normal, no murmur or gallops, no peripheral edema  Musculoskeletal: No deformities, no cyanosis or clubbing  Neuro: alert, awake, non focal  Skin: Warm, no lesions or rash      Assessment & Plan:   Abnormal CT of the chest Endobronchial and distal tracheal nodularity noted on several of her serial CT scans including the most recent cardiac calcium scoring scan.  These appear to be transient, suspect this was due to secretions.  She is due for her screening CT chest so we will perform it now, assess for resolution.  If the mainstem nodular areas persist then we can consider bronchoscopy and airway inspection.  Nicotine dependence Discussed cessation with her.  She is not ready to set a quit date at this time.  She likely does have COPD.  We discussed this today.  We  we will hold off on PFT, BD at this time since she is asymptomatic.  Main focus needs to be tobacco cessation at this time.   Levy Pupa, MD, PhD 07/08/2023, 3:33 PM [facility] [phone] or if no answer before 7:00PM call [phone] For any issues after 7:00PM please call eLink [phone]

## 2023-07-08 NOTE — Patient Instructions (Signed)
We will arrange for lung cancer screening CT chest that we can compare to your priors. We may decide to perform pulmonary function testing at some point going forward. We will hold off on starting any inhaler medications right now. Continue to work on decreasing your cigarettes.  We can talk about strategies to cut down or quit in the future. Follow Dr. Delton Coombes next available after your screening CT scan so we can review those results together

## 2023-07-08 NOTE — Assessment & Plan Note (Signed)
Endobronchial and distal tracheal nodularity noted on several of her serial CT scans including the most recent cardiac calcium scoring scan.  These appear to be transient, suspect this was due to secretions.  She is due for her screening CT chest so we will perform it now, assess for resolution.  If the mainstem nodular areas persist then we can consider bronchoscopy and airway inspection.

## 2023-07-14 ENCOUNTER — Ambulatory Visit: Payer: Medicare Other | Attending: Internal Medicine | Admitting: Internal Medicine

## 2023-07-14 ENCOUNTER — Encounter: Payer: Self-pay | Admitting: Internal Medicine

## 2023-07-14 VITALS — BP 112/70 | HR 85 | Ht 63.5 in | Wt 107.4 lb

## 2023-07-14 DIAGNOSIS — R931 Abnormal findings on diagnostic imaging of heart and coronary circulation: Secondary | ICD-10-CM

## 2023-07-14 NOTE — Progress Notes (Signed)
Cardiology Office Note:    Date:  07/14/2023   ID:  Alice White, DOB 04-Aug-1951, MRN [id number]  PCP:  Stevphen Rochester, MD   [facility] Providers Cardiologist:  None     Referring MD: Richardean Chimera, MD   No chief complaint on file. Elevated CAC  History of Present Illness:    Alice White is a 72 y.o. female with a hx of AAA, 42 pack year hx, HTN, referral for CAC 328 , 82nd percentile.  Alice White reports not having seen her primary doctor recently and that her gynecologist suggested a CAC score. She denies having any history of heart disease. No prior cardiac w/u  Her activity level has decreased in the past two months due to her husband's passing and taking care of him during his illness. She used to play golf three times a week but has not been as active recently. She is still active in her garden and rides a stationary bicycle occasionally. She denies experiencing any chest pressure or shortness of breath while performing these activities.  She is a current smoker, working on cutting back from a recent increase in smoking following her husband's passing. She is now smoking about half a pack a day. She has not had her cholesterol checked recently and is due for a well visit and physical.   She has a history of an abdominal aortic aneurysm (AAA) and is being followed by a vascular surgeon. The patient's blood pressure is well-controlled, and she is at a healthy weight. She is currently taking Alprazolam (Xanax) occasionally for sleep, not anxiety.   Past Medical History:  Diagnosis Date   AAA (abdominal aortic aneurysm) (HCC)    Anxiety    Hyperlipidemia    Hypertension    Vertigo     Past Surgical History:  Procedure Laterality Date   CESAREAN SECTION     x 2    Current Medications: Current Meds  Medication Sig   ALPRAZolam (XANAX) 0.25 MG tablet Take 0.25 mg by mouth daily as needed.   amLODipine (NORVASC) 5 MG tablet amlodipine 5 mg tablet   aspirin  81 MG chewable tablet Chew by mouth.   fenofibrate 160 MG tablet    hydrochlorothiazide (HYDRODIURIL) 25 MG tablet Take 25 mg by mouth daily.   ibandronate (BONIVA) 150 MG tablet Take 150 mg by mouth every 30 (thirty) days.   meclizine (ANTIVERT) 50 MG tablet Take by mouth.   Multiple Vitamins-Minerals (MULTIVITAMIN WITH MINERALS) tablet Take by mouth.   Omega-3 Fatty Acids (FISH OIL) 1000 MG CAPS Take by mouth.   simvastatin (ZOCOR) 20 MG tablet Take 20 mg by mouth at bedtime.     Allergies:   Patient has no known allergies.   Social History   Socioeconomic History   Marital status: Married    Spouse name: Not on file   Number of children: Not on file   Years of education: Not on file   Highest education level: Not on file  Occupational History   Not on file  Tobacco Use   Smoking status: Every Day    Current packs/day: 0.50    Types: Cigarettes    Passive exposure: Never   Smokeless tobacco: Never  Vaping Use   Vaping status: Never Used  Substance and Sexual Activity   Alcohol use: Not Currently   Drug use: Never   Sexual activity: Not on file  Other Topics Concern   Not on file  Social History Narrative  Not on file   Social Determinants of Health   Financial Resource Strain: Low Risk  (03/01/2022)   Received from [facility]   Overall Financial Resource Strain (CARDIA)    Difficulty of Paying Living Expenses: Not hard at all  Food Insecurity: No Food Insecurity (03/01/2022)   Received from [facility]   Hunger Vital Sign    Worried About Running Out of Food in the Last Year: Never true    Ran Out of Food in the Last Year: Never true  Transportation Needs: No Transportation Needs (02/26/2021)   Received from [facility]   [facility] - Transportation    Lack of Transportation (Medical): No    Lack of Transportation (Non-Medical): No  Physical Activity: Insufficiently Active (03/01/2022)   Received from [facility]   Exercise Vital Sign    Days of Exercise per Week: 3 days    Minutes of Exercise per Session: 20 min  Stress: No Stress Concern Present (03/01/2022)   Received from [facility], [facility] of Occupational Health - Occupational Stress Questionnaire    Feeling of Stress : Only a little  Social Connections: Unknown (03/04/2023)   Received from [facility]   Social Network    Social Network: Not on file     Family History: No premature CAD  ROS:   Please see the history of present illness.     All other systems reviewed and are negative.  EKGs/Labs/Other Studies Reviewed:    The following studies were reviewed today: EKG Interpretation Date/Time:  Wednesday July 14 2023 14:46:41 EDT Ventricular Rate:  71 PR Interval:  150 QRS Duration:  76 QT Interval:  396 QTC Calculation: 430 R Axis:   52  Text Interpretation: Normal sinus rhythm Minimal voltage criteria for LVH, may be normal variant ( Sokolow-Lyon ) Marked ST abnormality, possible anterior subendocardial injury No previous ECGs available Confirmed by Carolan Clines ([id number]) on 07/14/2023 2:48:40 PM       Recent Labs: No results found for requested labs within last 365 days.  Recent Lipid Panel No results found for: "CHOL", "TRIG", "HDL", "CHOLHDL", "VLDL", "LDLCALC", "LDLDIRECT"   Risk Assessment/Calculations:     Physical Exam:    VS:  BP 112/70   Pulse 85   Ht 5' 3.5" (1.613 m)   Wt 107 lb 6.4 oz (48.7 kg)   SpO2 97%   BMI 18.73 kg/m     Wt Readings from Last 3 Encounters:  07/14/23 107 lb 6.4 oz (48.7 kg)  07/08/23 105 lb (47.6 kg)  06/08/23 104 lb (47.2 kg)     GEN:  Well nourished, well developed in no acute distress HEENT: Normal NECK: No JVD; No carotid bruits CARDIAC: RRR, no murmurs, rubs, gallops RESPIRATORY:  Clear to auscultation without rales, wheezing or rhonchi  ABDOMEN: Soft, non-tender, non-distended MUSCULOSKELETAL:  No  edema; No deformity  SKIN: Warm and dry NEUROLOGIC:  Alert and oriented x 3 PSYCHIATRIC:  Normal affect   ASSESSMENT:   CAC: Elevated CAC: > 75th percentile.  His CAC score is elevated. Recommend statin therapy and asa 81 mg daily. LDL goal < 70 mg/dL. Recommend continued lifestyle modifications including healthy diet, lipid management,  and exercise. EKG shows anterior TWI, don't have any comparisons. She is asymptomatic.   AAA: followed by Dr. Lenell Antu  PLAN:    In order of problems listed above:  Fasting lipids, LDL goal < 70 mg/dL  TTE We discussed if CAD symptoms arise, to let us know Continue simvastatin 20 mg daily and asa 81 mg daily Smoking cessation Follow up 6 months      Medication Adjustments/Labs and Tests Ordered: Current medicines are reviewed at length with the patient today.  Concerns regarding medicines are outlined above.  No orders of the defined types were placed in this encounter.  No orders of the defined types were placed in this encounter.   There are no Patient Instructions on file for this visit.   Signed, Maisie Fus, MD  07/14/2023 2:34 PM    [facility]

## 2023-07-14 NOTE — Patient Instructions (Signed)
Medication Instructions:  Your physician recommends that you continue on your current medications as directed. Please refer to the Current Medication list given to you today.  *If you need a refill on your cardiac medications before your next appointment, please call your pharmacy*   Lab Work: LIPID PANEL  If you have labs (blood work) drawn today and your tests are completely normal, you will receive your results only by: MyChart Message (if you have MyChart) OR A paper copy in the mail If you have any lab test that is abnormal or we need to change your treatment, we will call you to review the results.   Testing/Procedures: Your physician has requested that you have an echocardiogram. Echocardiography is a painless test that uses sound waves to create images of your heart. It provides your doctor with information about the size and shape of your heart and how well your heart's chambers and valves are working. This procedure takes approximately one hour. There are no restrictions for this procedure. Please do NOT wear cologne, perfume, aftershave, or lotions (deodorant is allowed). Please arrive 15 minutes prior to your appointment time.    Follow-Up: At [facility], you and your health needs are our priority.  As part of our continuing mission to provide you with exceptional heart care, we have created designated Provider Care Teams.  These Care Teams include your primary Cardiologist (physician) and Advanced Practice Providers (APPs -  Physician Assistants and Nurse Practitioners) who all work together to provide you with the care you need, when you need it.    Your next appointment:   6 month(s)  Provider:   Maisie Fus, MD

## 2023-07-15 ENCOUNTER — Other Ambulatory Visit: Payer: Self-pay

## 2023-07-15 DIAGNOSIS — E785 Hyperlipidemia, unspecified: Secondary | ICD-10-CM

## 2023-07-15 MED ORDER — ROSUVASTATIN CALCIUM 20 MG PO TABS: 20.0000 mg | ORAL_TABLET | Freq: Every day | ORAL | 3 refills | Status: DC

## 2023-07-21 ENCOUNTER — Ambulatory Visit (HOSPITAL_COMMUNITY)
Admission: RE | Admit: 2023-07-21 | Discharge: 2023-07-21 | Disposition: A | Discharge status: Home / Self Care | Payer: Medicare Other | Source: Ambulatory Visit | Attending: Emergency Medicine | Admitting: Emergency Medicine

## 2023-07-21 DIAGNOSIS — F17210 Nicotine dependence, cigarettes, uncomplicated: Secondary | ICD-10-CM | POA: Insufficient documentation

## 2023-07-26 ENCOUNTER — Telehealth: Payer: Self-pay | Admitting: Internal Medicine

## 2023-07-26 NOTE — Telephone Encounter (Signed)
Pt returning call for lab results  

## 2023-07-28 ENCOUNTER — Telehealth: Payer: Self-pay | Admitting: Emergency Medicine

## 2023-07-28 NOTE — Telephone Encounter (Signed)
FYI, received call report of LDCT for lung RADS 4A. I did not call patient yet with results, I can reach out to them tomorrow if neither are you are working

## 2023-07-28 NOTE — Telephone Encounter (Signed)
 Vicente Serene calling with call report. For CT scan. Vicente Serene phone number is [phone].

## 2023-07-28 NOTE — Telephone Encounter (Signed)
CALL REPORT : IMPRESSION: 1. New/increasing scattered peribronchovascular nodularity in the right upper lobe with the largest lesion measuring 7.7 mm. Findings may be infectious/inflammatory in etiology. By size criteria, study is categorized as Lung-RADS 4A, suspicious. Follow up low-dose chest CT without contrast in 3 months (please use the following order, "CT CHEST LCS NODULE FOLLOW-UP W/O CM") is recommended. Alternatively, PET may be considered when there is a solid component 8mm or larger. These results will be called to the ordering clinician or representative by the Radiologist Assistant, and communication documented in the PACS or [employer]. 2. Aortic atherosclerosis (ICD10-I70.0). Coronary artery calcification. 3.  Emphysema (ICD10-J43.9).  Called [facility] to advise we have received report and will be forwarding to provider and DOD.

## 2023-07-29 ENCOUNTER — Telehealth: Payer: Self-pay | Admitting: Acute Care

## 2023-07-29 DIAGNOSIS — R911 Solitary pulmonary nodule: Secondary | ICD-10-CM

## 2023-07-29 DIAGNOSIS — Z87891 Personal history of nicotine dependence: Secondary | ICD-10-CM

## 2023-07-29 NOTE — Telephone Encounter (Addendum)
I have attempted to call the patient with the results of their  Low Dose CT Chest Lung cancer screening scan. There was no answer. I have left a HIPPA compliant VM requesting the patient call the office for the scan results. I included the office contact information in the message. We will await his return call. If no return call we will continue to call until patient is contacted.  Screening team. She needs a 3 month follow up scan. Scan was read as a 4A. She has a new7.7.mm lesion in the right upper lobe.  If she calls back, there is a chance this is infectious or inflammatory. Please ask if she has been sick. We will do the 3 month follow up either way, but if she has been sick she needs to go to her PCP for treatment. Also, please fax results to PCP. Thanks so much Dr. Delton Coombes, just as an Lorain Childes, we will call this

## 2023-07-29 NOTE — Telephone Encounter (Signed)
Thanks Sarah

## 2023-07-29 NOTE — Telephone Encounter (Signed)
Gave patient results and advised of labs to be done in 3 months. She states understanding .

## 2023-07-29 NOTE — Telephone Encounter (Signed)
Patient returned staff call regarding results and stated can reach her at [phone].

## 2023-07-29 NOTE — Telephone Encounter (Signed)
Spoke with pt and advised of CT results per Kandice Robinsons, NP. PT reports that she did some cold symptoms about a week ago but is not feeling bad. I advised pt we will repeat his Chest CT in 3 months to assess the nodules seen. Pt verbalized understanding. Results/ plans faxed to PCP. Order placed for 3 month nodule f/u CT.

## 2023-07-29 NOTE — Addendum Note (Signed)
Addended by: Abigail Miyamoto D on: 07/29/2023 04:22 PM   Modules accepted: Orders

## 2023-07-30 ENCOUNTER — Ambulatory Visit (HOSPITAL_COMMUNITY): Payer: Medicare Other | Attending: Internal Medicine

## 2023-07-30 DIAGNOSIS — R931 Abnormal findings on diagnostic imaging of heart and coronary circulation: Secondary | ICD-10-CM | POA: Diagnosis present

## 2023-07-30 DIAGNOSIS — I2510 Atherosclerotic heart disease of native coronary artery without angina pectoris: Secondary | ICD-10-CM

## 2023-07-30 DIAGNOSIS — I517 Cardiomegaly: Secondary | ICD-10-CM | POA: Diagnosis present

## 2023-07-30 LAB — ECHOCARDIOGRAM COMPLETE
Area-P 1/2: 2.93 cm2
S' Lateral: 3.3 cm

## 2023-08-11 ENCOUNTER — Encounter: Payer: Self-pay | Admitting: Family Medicine

## 2023-08-11 ENCOUNTER — Ambulatory Visit: Payer: Medicare Other | Admitting: Family Medicine

## 2023-08-11 VITALS — BP 142/84 | HR 81 | Ht 63.5 in | Wt 108.0 lb

## 2023-08-11 DIAGNOSIS — M949 Disorder of cartilage, unspecified: Secondary | ICD-10-CM

## 2023-08-11 DIAGNOSIS — M8588 Other specified disorders of bone density and structure, other site: Secondary | ICD-10-CM

## 2023-08-11 DIAGNOSIS — M899 Disorder of bone, unspecified: Secondary | ICD-10-CM | POA: Diagnosis not present

## 2023-08-11 LAB — COMPREHENSIVE METABOLIC PANEL
ALT: 12 U/L (ref 0–35)
AST: 21 U/L (ref 0–37)
Albumin: 4.3 g/dL (ref 3.5–5.2)
Alkaline Phosphatase: 40 U/L (ref 39–117)
BUN: 15 mg/dL (ref 6–23)
CO2: 27 meq/L (ref 19–32)
Calcium: 10.4 mg/dL (ref 8.4–10.5)
Chloride: 99 meq/L (ref 96–112)
Creatinine, Ser: 0.79 mg/dL (ref 0.40–1.20)
GFR: 73.64 mL/min (ref >60.00)
Glucose, Bld: 82 mg/dL (ref 70–99)
Potassium: 3.8 meq/L (ref 3.5–5.1)
Sodium: 136 meq/L (ref 135–145)
Total Bilirubin: 0.5 mg/dL (ref 0.2–1.2)
Total Protein: 8 g/dL (ref 6.0–8.3)

## 2023-08-11 LAB — VITAMIN D 25 HYDROXY (VIT D DEFICIENCY, FRACTURES): VITD: 54.77 ng/mL (ref 30.00–100.00)

## 2023-08-11 NOTE — Patient Instructions (Signed)
Thank you for coming in today.   Please get labs today before you leave   I think Alice White is probably just fine.   Think about Prolia and let me know if you would like me to start it. It is probably better.   I do think regular weight bearing exercises is good for you bones and the rest of you.  Walking is good as is gardening.  Golf is a good idea.

## 2023-08-11 NOTE — Progress Notes (Unsigned)
I, Alice White, CMA acting as a scribe for Alice Graham, MD.  Alice White is a 72 y.o. female who presents to [facility] today for osteoporosis. management.   She has a diagnosis of osteopenia indicated by T-score around -1.9.  She does have a history of a foot fracture 14 years ago.  She has been taking Boniva for the last 2 years and tolerates it well.  DEXA scan (date, T-score): 06/07/23 T-score -1.9 at LFN, -1.2 at AP spine, -1.7 at RFN Prior treatment: Boniva - currently taking History of Hip, Spine, or Wrist Fx: LEFT foot (2011) fall from standing height Heart disease or stroke: no Cancer: no Kidney Disease: stage 3a (pt unaware of this diagnosis) Gastric/Peptic Ulcer: no Gastric bypass surgery: no Severe GERD: no Hx of seizures: no Age at Menopause: 52 Calcium intake: through diet and MVI - discontinued d/t elevated calcium levels while on Boniva Vitamin D intake: through diet and MVI Hormone replacement therapy: no Smoking history: former 55+ years Alcohol: no Exercise: less often while taking care of husband with ALS, passed in 05-14-23. Has started light weight lifting and stationary bike. Also gardens. Major dental work in past year: no Parents with hip/spine fracture: no Height loss: YES   Pertinent review of systems: No fevers or chills  Relevant historical information: Smoker.  Her husband died of ALS in May 14, 2023 of this year. History of hypercalcemia in the past.  Exam:  BP (!) 142/84   Pulse 81   Ht 5' 3.5" (1.613 m)   Wt 108 lb (49 kg)   SpO2 99%   BMI 18.83 kg/m  General: Well Developed, well nourished, and in no acute distress.   MSK: Normal trunk motion.  Normal gait.    Lab and Radiology Results Results for orders placed or performed in visit on 08/11/23 (from the past 72 hour(s))  Comprehensive metabolic panel     Status: None   Collection Time: 08/11/23  3:37 PM  Result Value Ref Range   Sodium 136 135 - 145  mEq/L   Potassium 3.8 3.5 - 5.1 mEq/L   Chloride 99 96 - 112 mEq/L   CO2 27 19 - 32 mEq/L   Glucose, Bld 82 70 - 99 mg/dL   BUN 15 6 - 23 mg/dL   Creatinine, Ser 4.09 0.40 - 1.20 mg/dL   Total Bilirubin 0.5 0.2 - 1.2 mg/dL   Alkaline Phosphatase 40 39 - 117 U/L   AST 21 0 - 37 U/L   ALT 12 0 - 35 U/L   Total Protein 8.0 6.0 - 8.3 g/dL   Albumin 4.3 3.5 - 5.2 g/dL   GFR 81.19 >14.78 mL/min    Comment: Calculated using the CKD-EPI Creatinine Equation (2021)   Calcium 10.4 8.4 - 10.5 mg/dL  VITAMIN D 25 Hydroxy (Vit-D Deficiency, Fractures)     Status: None   Collection Time: 08/11/23  3:37 PM  Result Value Ref Range   VITD 54.77 30.00 - 100.00 ng/mL   No results found.     Assessment and Plan: 72 y.o. female with decreased bone mineral density the indicated by T-score of -1.9.  She did have a fracture over 10 years ago but I do not think it is a fragility fracture.  We talked about medication and lifestyle management.  She her activity level has decreased quite a bit during the long illness of ALS with her husband.  She is just try to get back  into more activity.  We talked about some options including walking and golf and gardening all of which she enjoys.  She already is taking some vitamin D so understanding her current vitamin D level would be helpful as well.  Will check vitamin D.  We talked about medication management.  After discussion she will think about Prolia.  Her current regimen of Boniva is probably adequate for a total of 5 years.  Prolia may be better.  She will think about it and let me know and we can work on cost estimation if indicated.   PDMP not reviewed this encounter. Orders Placed This Encounter  Procedures   VITAMIN D 25 Hydroxy (Vit-D Deficiency, Fractures)    Standing Status:   Future    Number of Occurrences:   1    Standing Expiration Date:   08/10/2024   Comprehensive metabolic panel    Standing Status:   Future    Number of Occurrences:   1     Standing Expiration Date:   08/10/2024   PTH, Intact and Calcium    Standing Status:   Future    Number of Occurrences:   1    Standing Expiration Date:   08/10/2024   No orders of the defined types were placed in this encounter.    Discussed warning signs or symptoms. Please see discharge instructions. Patient expresses understanding.   The above documentation has been reviewed and is accurate and complete Alice White, M.D.

## 2023-08-14 LAB — PTH, INTACT AND CALCIUM
Calcium: 10.3 mg/dL (ref 8.6–10.4)
PTH: 11 pg/mL — ABNORMAL LOW (ref 16–77)

## 2023-08-24 NOTE — Progress Notes (Signed)
Parathyroid hormone is a bit low but calcium level is appropriate.  I think it is reasonable to continue her current treatment for osteoporosis as we discussed.  Might be reasonable to recheck these labs in 6 months.  I am happy to arrange for that if she would like.

## 2023-10-22 ENCOUNTER — Ambulatory Visit (HOSPITAL_COMMUNITY)
Admission: RE | Admit: 2023-10-22 | Discharge: 2023-10-22 | Disposition: A | Discharge status: Home / Self Care | Payer: Medicare Other | Source: Ambulatory Visit | Attending: Emergency Medicine | Admitting: Emergency Medicine

## 2023-10-22 DIAGNOSIS — R911 Solitary pulmonary nodule: Secondary | ICD-10-CM | POA: Insufficient documentation

## 2023-10-22 DIAGNOSIS — Z87891 Personal history of nicotine dependence: Secondary | ICD-10-CM | POA: Insufficient documentation

## 2023-11-29 ENCOUNTER — Telehealth: Payer: Self-pay | Admitting: *Deleted

## 2023-11-29 NOTE — Telephone Encounter (Signed)
Per 07/15/23 result note from Dr. Wyline Mood:   "Please let Alice White know her LDL is near goal. Please change her simvastatin 20 mg daily to crestor 20 mg daily. Please repeat fasting lipids in 3 months."  [facility] requesting call back. Main number provided. Will remind patient that repeat fasting lipids are due now. Plan to also confirm that patient is taking rosuvastatin 20mg  daily and that she discontinued simvastatin (will need to remove this from med list after confirmation received).

## 2023-12-10 NOTE — Telephone Encounter (Signed)
Pt returning nurses phone call. Please advise ?

## 2023-12-10 NOTE — Telephone Encounter (Signed)
 Patient identification verified by 2 forms. Bertina Cooks, RN    Called and spoke to patient  Relayed provider result message  Patient states:   -she is currently taking Rosuvastatin , has been taking since 07/2023  -simvastatin was discontinued by Dr. Alvan   -she is also taking Fenofibrate  Informed patient:   -continue taking rosuvastatin    -Lipid labs due, complete fasting labs  Patient verbalized understanding, no questions at this time

## 2023-12-15 ENCOUNTER — Other Ambulatory Visit: Payer: Self-pay

## 2023-12-15 DIAGNOSIS — E785 Hyperlipidemia, unspecified: Secondary | ICD-10-CM

## 2023-12-16 LAB — LIPID PANEL
Chol/HDL Ratio: 2.9 {ratio} (ref 0.0–4.4)
Cholesterol, Total: 123 mg/dL (ref 100–199)
HDL: 43 mg/dL (ref >39)
LDL Chol Calc (NIH): 64 mg/dL (ref 0–99)
Triglycerides: 81 mg/dL (ref 0–149)
VLDL Cholesterol Cal: 16 mg/dL (ref 5–40)

## 2023-12-21 ENCOUNTER — Other Ambulatory Visit (HOSPITAL_COMMUNITY): Payer: Medicare Other

## 2023-12-21 ENCOUNTER — Ambulatory Visit: Payer: Medicare Other | Admitting: Vascular Surgery

## 2023-12-31 NOTE — Progress Notes (Signed)
Opened in error

## 2024-01-03 NOTE — Progress Notes (Unsigned)
Vascular and Vein Specialist of [place]  Patient name: Alice White MRN: [id number] DOB: Apr 18, 1951 Sex: female  REASON FOR CONSULT: Evaluation of incidental finding of abdominal aortic aneurysm  HPI: Alice White is a 73 y.o. female, who is here today for discussion of abdominal aortic aneurysm.  She had no prior knowledge of this.  She initially underwent a lung cancer low dose CT scan of her chest.  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  Also had a renal cyst and underwent MRI for further evaluation of the left renal cyst.  I have reviewed her CT scan and her MRI images.  She has no history of cardiac disease and no history of peripheral vascular occlusive disease and no history of stroke  12/31/20: Patient returns to clinic with CTA to discuss.  She has no symptoms.  06/02/22: Returns for surveillance.  She is doing well overall.  No symptoms attributable to her aneurysm.  We again reviewed the unique anatomy of her aortic aneurysm.  06/08/23: Patient returns for her annual aneurysm surveillance.  We again reviewed the complex nature of her aneurysm anatomy.  Her aneurysm has grown to 48 mm by duplex.  I counseled her that she will likely need repair in the next year or 2.  We reviewed the 2 main techniques of aneurysm repair.  I counseled her that she should obtain an opinion from Dr. Bennie Pierini at [facility] regarding her candidacy for complex endovascular repair when she reaches a size threshold for repair.  She is understanding.  Past Medical History:  Diagnosis Date   AAA (abdominal aortic aneurysm) (HCC)    Anxiety    Hyperlipidemia    Hypertension    Vertigo     Family History  Problem Relation Age of Onset   Heart disease Mother    Hypertension Mother    Heart disease Father     SOCIAL HISTORY: Social History   Socioeconomic History   Marital status: Married    Spouse name: Not on file   Number of children: Not on file    Years of education: Not on file   Highest education level: Not on file  Occupational History   Not on file  Tobacco Use   Smoking status: Every Day    Current packs/day: 0.50    Average packs/day: 0.5 packs/day for 54.0 years (27.0 ttl pk-yrs)    Types: Cigarettes    Passive exposure: Never   Smokeless tobacco: Never  Vaping Use   Vaping status: Never Used  Substance and Sexual Activity   Alcohol use: Not Currently   Drug use: Never   Sexual activity: Not Currently    Birth control/protection: Post-menopausal, Surgical  Other Topics Concern   Not on file  Social History Narrative   Not on file   Social Drivers of Health   Financial Resource Strain: Low Risk  (10/04/2023)   Received from [facility]   Overall Financial Resource Strain (CARDIA)    Difficulty of Paying Living Expenses: Not hard at all  Food Insecurity: No Food Insecurity (10/04/2023)   Received from [facility]   Hunger Vital Sign    Worried About Running Out of Food in the Last Year: Never true    Ran Out of Food in the Last Year: Never true  Transportation Needs: No Transportation Needs (10/04/2023)   Received from [facility] - Transportation    Lack of Transportation (Medical): No    Lack of  Transportation (Non-Medical): No  Physical Activity: Insufficiently Active (10/04/2023)   Received from [facility]   Exercise Vital Sign    Days of Exercise per Week: 3 days    Minutes of Exercise per Session: 30 min  Stress: No Stress Concern Present (10/04/2023)   Received from [facility] - Occupational Stress Questionnaire    Feeling of Stress : Only a little  Social Connections: Socially Integrated (10/04/2023)   Received from [facility]   Social Network    How would you rate your social network (family, work, friends)?: Good participation with social networks  Intimate Partner Violence: Not At Risk (10/04/2023)   Received from [facility]   HITS    Over the last 12 months how often did your partner physically hurt you?: Never    Over the last 12 months how often did your partner insult you or talk down to you?: Never    Over the last 12 months how often did your partner threaten you with physical harm?: Never    Over the last 12 months how often did your partner scream or curse at you?: Never    No Known Allergies  Current Outpatient Medications  Medication Sig Dispense Refill   ALPRAZolam (XANAX) 0.25 MG tablet Take 0.25 mg by mouth daily as needed.     amLODipine (NORVASC) 5 MG tablet amlodipine 5 mg tablet     aspirin 81 MG chewable tablet Chew by mouth.     fenofibrate 160 MG tablet      hydrochlorothiazide (HYDRODIURIL) 25 MG tablet Take 25 mg by mouth daily.     ibandronate (BONIVA) 150 MG tablet Take 150 mg by mouth every 30 (thirty) days.     meclizine (ANTIVERT) 50 MG tablet Take by mouth.     Multiple Vitamins-Minerals (MULTIVITAMIN WITH MINERALS) tablet Take by mouth.     Omega-3 Fatty Acids (FISH OIL) 1000 MG CAPS Take by mouth.     rosuvastatin (CRESTOR) 20 MG tablet Take 1 tablet (20 mg total) by mouth daily. 90 tablet 3   No current facility-administered medications for this visit.    PHYSICAL EXAM: There were no vitals filed for this visit.    GENERAL: The patient is a well-nourished female, in no acute distress. The vital signs are documented above. CARDIOVASCULAR: Carotid arteries are without bruits bilaterally.  She has 2+ radial 2+ femoral 2+ popliteal and 2+ dorsalis pedis pulses with no evidence of peripheral artery aneurysm PULMONARY: There is good air exchange  ABDOMEN: Soft and non-tender.  She does have a prominent aortic pulse MUSCULOSKELETAL: There are no major deformities or cyanosis. NEUROLOGIC: No focal weakness or paresthesias are detected. SKIN: There are no ulcers or rashes noted. PSYCHIATRIC: The patient has a normal affect.  DATA:  CTA shows 39mm paravisceral aortic  aneurysm. 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 Open repair would also be challenging but feasible in our facility She is much more interested in endovascular repair than open repair We will plan to see her again in six months with duplex of the aorta. Once she reaches the threshold of 50 mm for intervention, I  will plan to refer her to Dr. Bennie Pierini at [facility] to discuss endovascular repair.  Rande Brunt. Lenell Antu, MD [facility] Vascular and Vein Specialists of [facility] Phone Number: [phone] 01/03/2024 9:56 AM

## 2024-01-04 ENCOUNTER — Ambulatory Visit (HOSPITAL_COMMUNITY)
Admission: RE | Admit: 2024-01-04 | Discharge: 2024-01-04 | Disposition: A | Discharge status: Home / Self Care | Payer: Medicare Other | Source: Ambulatory Visit | Attending: Vascular Surgery | Admitting: Vascular Surgery

## 2024-01-04 ENCOUNTER — Ambulatory Visit: Payer: Medicare Other | Admitting: Vascular Surgery

## 2024-01-04 ENCOUNTER — Encounter: Payer: Self-pay | Admitting: Vascular Surgery

## 2024-01-04 VITALS — BP 122/74 | HR 71 | Temp 97.7°F | Resp 20 | Ht 63.5 in | Wt 114.0 lb

## 2024-01-04 DIAGNOSIS — I7162 Paravisceral aneurysm of the thoracoabdominal aorta, without rupture: Secondary | ICD-10-CM | POA: Diagnosis not present

## 2024-01-04 DIAGNOSIS — I714 Abdominal aortic aneurysm, without rupture, unspecified: Secondary | ICD-10-CM | POA: Insufficient documentation

## 2024-01-20 ENCOUNTER — Other Ambulatory Visit: Payer: Self-pay

## 2024-01-20 DIAGNOSIS — I714 Abdominal aortic aneurysm, without rupture, unspecified: Secondary | ICD-10-CM

## 2024-02-11 ENCOUNTER — Telehealth: Payer: Self-pay | Admitting: Internal Medicine

## 2024-02-11 MED ORDER — ROSUVASTATIN CALCIUM 20 MG PO TABS: 20.0000 mg | ORAL_TABLET | Freq: Every day | ORAL | 1 refills | Status: DC

## 2024-02-11 NOTE — Telephone Encounter (Signed)
 Pt's medication was sent to pt's pharmacy as requested. Confirmation received.

## 2024-02-11 NOTE — Telephone Encounter (Signed)
*  STAT* If patient is at the pharmacy, call can be transferred to refill team.   1. Which medications need to be refilled? (please list name of each medication and dose if known)  rosuvastatin (CRESTOR) 20 MG tablet   2. Which pharmacy/location (including street and city if local pharmacy) is medication to be sent to? [facility]) - [place], [place] - [address] [place]  3. Do they need a 30 day or 90 day supply?   90 day supply

## 2024-07-03 ENCOUNTER — Ambulatory Visit: Attending: Cardiology | Admitting: Cardiology

## 2024-07-03 ENCOUNTER — Encounter: Payer: Self-pay | Admitting: Cardiology

## 2024-07-03 VITALS — BP 136/76 | HR 83 | Ht 63.0 in | Wt 118.6 lb

## 2024-07-03 DIAGNOSIS — I10 Essential (primary) hypertension: Secondary | ICD-10-CM

## 2024-07-03 DIAGNOSIS — I2510 Atherosclerotic heart disease of native coronary artery without angina pectoris: Secondary | ICD-10-CM | POA: Diagnosis not present

## 2024-07-03 DIAGNOSIS — F17210 Nicotine dependence, cigarettes, uncomplicated: Secondary | ICD-10-CM

## 2024-07-03 DIAGNOSIS — I77810 Thoracic aortic ectasia: Secondary | ICD-10-CM | POA: Insufficient documentation

## 2024-07-03 DIAGNOSIS — I7143 Infrarenal abdominal aortic aneurysm, without rupture: Secondary | ICD-10-CM | POA: Insufficient documentation

## 2024-07-03 NOTE — Progress Notes (Signed)
 Cardiology Office Note:  .   Date:  07/03/2024  ID:  Alice White, DOB Apr 20, 1951, MRN [id number] PCP: Gladystine Erminio CROME, MD  [facility] Providers Cardiologist:  Newman Lawrence, MD PCP: Gladystine Erminio CROME, MD  Chief Complaint  Patient presents with   Hypertension   AAA     MOSELLA Alice White is a 73 y.o. female with hypertension, elevated CAC, AAA 4.7 cm, TAA, mildly dilated ascending aorta 3.9 cm, nicotine dependence  History of Present Illness  Patient is doing well from a cardiac standpoint.  She stays fairly active, without any complaints of chest pain or shortness of breath.  Unfortunately, she continues to smoke half pack a day, but not ready to quit yet.  She is seeing Dr. Magda for regular follow-up of her AAA which is currently at 4.7 cm.  She is getting every 43-month vascular ultrasound.  Has been discussion about open versus endovascular repair if and when necessary.    Vitals:   07/03/24 1615  BP: 136/76  Pulse: 83  SpO2: 96%      Review of Systems  Cardiovascular:  Negative for chest pain, dyspnea on exertion, leg swelling, palpitations and syncope.        Studies Reviewed: SABRA        EKG 07/03/2024: Normal sinus rhythm Minimal voltage criteria for LVH, may be normal variant ( Sokolow-Lyon ) Marked ST abnormality, possible inferior subendocardial injury When compared with ECG of 14-Jul-2023 14:46, No significant change was found  Labs 08/2023-12/2023: Chol 123, TG 81, HDL 43, LDL 64 Cr 0.79  Vascular US  12/2023: Abdominal Aorta:  - AAA (mid) with the largest diameter of 4.7 cm (Prior: 4.8 cm)  - Bilateral common iliac arteries are patent with no evidence of stenosis.  - Right CIA: 0.8 cm.  - Left CIA: 0.8 cm.      CT chest 10/2023: 1. Lung-RADS 2, benign appearance or behavior. Continue annual screening with low-dose chest CT without contrast in 12 months. 2. 1.5 cm hyperdense right thyroid nodule. Recommend thyroid ultrasound. (Ref:  J Am Coll Radiol. 2015 Feb;12(2): 143-50). 3. Partially imaged infrarenal aortic aneurysm measures at least 4.1 cm, last imaged on 05/27/2022 CT angiography of the abdomen and pelvis. 4. Aortic atherosclerosis (ICD10-I70.0). Coronary artery calcification. 5.  Emphysema (ICD10-J43.9).    CT cardiac scoring 05/2023: 1. Aortic Atherosclerosis (ICD10-I70.0) and Emphysema (ICD10-J43.9). 2. Nodular areas within the trachea and central bronchi are likely represent retained secretions, however are indeterminate. 3. Recommend continued annual lung cancer screening CT for follow-up of prior pulmonary nodules.   Coronary Calcium  Score: Left main: 79 Left anterior descending artery: 197 Left circumflex artery: 52 Right coronary artery: 0   Total: 328 Percentile: 82   Pericardium: Normal. Aorta: Normal caliber of ascending aorta. Aortic atherosclerosis noted.    Physical Exam Vitals and nursing note reviewed.  Constitutional:      General: She is not in acute distress. Neck:     Vascular: No JVD.  Cardiovascular:     Rate and Rhythm: Normal rate and regular rhythm.     Heart sounds: Normal heart sounds. No murmur heard. Pulmonary:     Effort: Pulmonary effort is normal.     Breath sounds: Normal breath sounds. No wheezing or rales.  Musculoskeletal:     Right lower leg: No edema.     Left lower leg: No edema.      VISIT DIAGNOSES:   ICD-10-CM   1. Coronary artery disease involving native coronary  artery of native heart without angina pectoris  I25.10 EKG 12-Lead    2. Infrarenal abdominal aortic aneurysm (AAA) without rupture (HCC)  I71.43 EKG 12-Lead    3. Primary hypertension  I10 EKG 12-Lead    4. Ascending aorta dilatation (HCC)  I77.810 ECHOCARDIOGRAM COMPLETE    5. Cigarette nicotine dependence without complication  F17.210        Alice White is a 73 y.o. female with hypertension, elevated CAC, AAA 4.7 cm, TAA, mildly dilated ascending aorta 3.9 cm, nicotine  dependence Assessment & Plan  Elevated coronary calcium  score: Including left main.  Fortunately, no anginal symptoms at this time. Continue aspirin, statin, lipids well-controlled. If and when she would require repair for her AAA, we will reassess her cardiac risk at that time and consider ischemia testing if felt necessary.  AAA: Currently 4.7 cm.  Continue follow-up with Dr. Magda. Emphasize importance of smoking cessation, see below.  Dilated ascending aorta: 3.9 cm on echocardiogram in 07/2023, and noncontrast CT scan in 10/2023. Repeat echocardiogram in 10/2024. No surgical indication at this time.  Nicotine dependence: Tobacco cessation counseling:  - Currently smoking 1/2 packs/day   - Patient was informed of the dangers of tobacco abuse including stroke, cancer, and MI, as well as benefits of tobacco cessation. - Patient is NOT willing to quit at this time. - Approximately 5 mins were spent counseling patient cessation techniques. We discussed various methods to help quit smoking, including deciding on a date to quit, joining a support group, pharmacological agents.  - I will reassess her progress at the next follow-up visit     F/u in 6 months  Signed, Newman JINNY Lawrence, MD

## 2024-07-03 NOTE — Patient Instructions (Signed)
  Testing/Procedures: ECHO IN 11/2024  Your physician has requested that you have an echocardiogram. Echocardiography is a painless test that uses sound waves to create images of your heart. It provides your doctor with information about the size and shape of your heart and how well your heart's chambers and valves are working. This procedure takes approximately one hour. There are no restrictions for this procedure. Please do NOT wear cologne, perfume, aftershave, or lotions (deodorant is allowed). Please arrive 15 minutes prior to your appointment time.  Please note: We ask at that you not bring children with you during ultrasound (echo/ vascular) testing. Due to room size and safety concerns, children are not allowed in the ultrasound rooms during exams. Our front office staff cannot provide observation of children in our lobby area while testing is being conducted. An adult accompanying a patient to their appointment will only be allowed in the ultrasound room at the discretion of the ultrasound technician under special circumstances. We apologize for any inconvenience.   Follow-Up: At [facility], you and your health needs are our priority.  As part of our continuing mission to provide you with exceptional heart care, our providers are all part of one team.  This team includes your primary Cardiologist (physician) and Advanced Practice Providers or APPs (Physician Assistants and Nurse Practitioners) who all work together to provide you with the care you need, when you need it.  Your next appointment:   11/2024 AFTER ECHO  Provider:   Newman JINNY Lawrence, MD

## 2024-07-31 NOTE — Progress Notes (Unsigned)
 Vascular and Vein Specialist of [place]  Patient name: Alice White MRN: [id number] DOB: 1951-07-22 Sex: female  REASON FOR CONSULT: Evaluation of incidental finding of abdominal aortic aneurysm  HPI: Alice White is a 73 y.o. female, who is here today for discussion of abdominal aortic aneurysm.  She had no prior knowledge of this.  She initially underwent a lung cancer low dose CT scan of her chest.  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  Also had a renal cyst and underwent MRI for further evaluation of the left renal cyst.  I have reviewed her CT scan and her MRI images.  She has no history of cardiac disease and no history of peripheral vascular occlusive disease and no history of stroke  12/31/20: Patient returns to clinic with CTA to discuss.  She has no symptoms.  06/02/22: Returns for surveillance.  She is doing well overall.  No symptoms attributable to her aneurysm.  We again reviewed the unique anatomy of her aortic aneurysm.  06/08/23: Patient returns for her annual aneurysm surveillance.  We again reviewed the complex nature of her aneurysm anatomy.  Her aneurysm has grown to 48 mm by duplex.  I counseled her that she will likely need repair in the next year or 2.  We reviewed the 2 main techniques of aneurysm repair.  I counseled her that she should obtain an opinion from Dr. Oneil Phlegm at [facility] regarding her candidacy for complex endovascular repair when she reaches a size threshold for repair.  She is understanding.  01/04/24: patient returns for surveillance. She is doing well overall. We reviewed her non-invasive testing.   Past Medical History:  Diagnosis Date   AAA (abdominal aortic aneurysm) (HCC)    Anxiety    Coronary artery disease involving native coronary artery of native heart without angina pectoris 07/03/2024   Hyperlipidemia    Hypertension    Vertigo     Family History  Problem Relation Age of Onset   Heart  disease Mother    Hypertension Mother    Heart disease Father     SOCIAL HISTORY: Social History   Socioeconomic History   Marital status: Married    Spouse name: Not on file   Number of children: Not on file   Years of education: Not on file   Highest education level: Not on file  Occupational History   Not on file  Tobacco Use   Smoking status: Every Day    Current packs/day: 0.50    Average packs/day: 0.5 packs/day for 54.0 years (27.0 ttl pk-yrs)    Types: Cigarettes    Passive exposure: Never   Smokeless tobacco: Never  Vaping Use   Vaping status: Never Used  Substance and Sexual Activity   Alcohol use: Not Currently   Drug use: Never   Sexual activity: Not Currently    Birth control/protection: Post-menopausal, Surgical  Other Topics Concern   Not on file  Social History Narrative   Not on file   Social Drivers of Health   Financial Resource Strain: Low Risk  (02/04/2024)   Received from [facility]   Overall Financial Resource Strain (CARDIA)    Difficulty of Paying Living Expenses: Not very hard  Food Insecurity: No Food Insecurity (02/04/2024)   Received from [facility]   Hunger Vital Sign    Within the past 12 months, you worried that your food would run out before you got the money to buy more.: Never true  Within the past 12 months, the food you bought just didn't last and you didn't have money to get more.: Never true  Transportation Needs: No Transportation Needs (02/04/2024)   Received from [facility]   PRAPARE - Transportation    Lack of Transportation (Medical): No    Lack of Transportation (Non-Medical): No  Physical Activity: Insufficiently Active (10/04/2023)   Received from [facility]   Exercise Vital Sign    On average, how many days per week do you engage in moderate to strenuous exercise (like a brisk walk)?: 3 days    On average, how many minutes do you engage in exercise at this level?: 30 min  Stress: No Stress Concern  Present (10/04/2023)   Received from [facility] - Occupational Stress Questionnaire    Feeling of Stress : Only a little  Social Connections: Socially Integrated (10/04/2023)   Received from [facility]   Social Network    How would you rate your social network (family, work, friends)?: Good participation with social networks  Intimate Partner Violence: Not At Risk (10/04/2023)   Received from [facility]   HITS    Over the last 12 months how often did your partner physically hurt you?: Never    Over the last 12 months how often did your partner insult you or talk down to you?: Never    Over the last 12 months how often did your partner threaten you with physical harm?: Never    Over the last 12 months how often did your partner scream or curse at you?: Never    No Known Allergies  Current Outpatient Medications  Medication Sig Dispense Refill   ALPRAZolam (XANAX) 0.25 MG tablet Take 0.25 mg by mouth daily as needed.     amLODipine (NORVASC) 5 MG tablet amlodipine 5 mg tablet     aspirin 81 MG chewable tablet Chew by mouth.     fenofibrate 160 MG tablet      hydrochlorothiazide (HYDRODIURIL) 25 MG tablet Take 25 mg by mouth daily.     ibandronate (BONIVA) 150 MG tablet Take 150 mg by mouth every 30 (thirty) days.     meclizine (ANTIVERT) 50 MG tablet Take by mouth.     Multiple Vitamins-Minerals (MULTIVITAMIN WITH MINERALS) tablet Take by mouth.     Omega-3 Fatty Acids (FISH OIL) 1000 MG CAPS Take by mouth.     rosuvastatin  (CRESTOR ) 20 MG tablet Take 1 tablet (20 mg total) by mouth daily. 90 tablet 1   No current facility-administered medications for this visit.    PHYSICAL EXAM: There were no vitals filed for this visit.    GENERAL: The patient is a well-nourished female, in no acute distress. The vital signs are documented above. CARDIOVASCULAR: Carotid arteries are without bruits bilaterally.  She has 2+ radial 2+ femoral  2+ popliteal and 2+ dorsalis pedis pulses with no evidence of peripheral artery aneurysm PULMONARY: There is good air exchange  ABDOMEN: Soft and non-tender.  She does have a prominent aortic pulse MUSCULOSKELETAL: There are no major deformities or cyanosis. NEUROLOGIC: No focal weakness or paresthesias are detected. SKIN: There are no ulcers or rashes noted. PSYCHIATRIC: The patient has a normal affect.  DATA:  Prior CTA shows 39mm paravisceral aortic aneurysm. 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 female with 47 mm paravisceral aortic aneurysm She has multiple anatomic abnormalities which would make fenestrated repair or parallel grafting challenging. Open repair would also be challenging but feasible in our facility She is much more interested in endovascular repair than open repair We will plan to see her again in six months with duplex of the aorta. Once she reaches the threshold of 50 mm for intervention, I will plan to refer her to Dr[facility] Phlegm at [facility] to discuss complex endovascular repair.  Debby SAILOR. Magda, MD [facility] Vascular and Vein Specialists of [facility] Phone Number: [phone] 07/31/2024 6:31 PM

## 2024-08-01 ENCOUNTER — Encounter: Payer: Self-pay | Admitting: Vascular Surgery

## 2024-08-01 ENCOUNTER — Ambulatory Visit: Payer: Medicare Other | Attending: Vascular Surgery | Admitting: Vascular Surgery

## 2024-08-01 ENCOUNTER — Ambulatory Visit (HOSPITAL_COMMUNITY)
Admission: RE | Admit: 2024-08-01 | Discharge: 2024-08-01 | Disposition: A | Discharge status: Home / Self Care | Payer: Medicare Other | Source: Ambulatory Visit | Attending: Vascular Surgery | Admitting: Vascular Surgery

## 2024-08-01 VITALS — BP 129/82 | HR 70 | Temp 97.7°F | Ht 63.0 in | Wt 119.0 lb

## 2024-08-01 DIAGNOSIS — I714 Abdominal aortic aneurysm, without rupture, unspecified: Secondary | ICD-10-CM | POA: Insufficient documentation

## 2024-08-01 DIAGNOSIS — I7162 Paravisceral aneurysm of the thoracoabdominal aorta, without rupture: Secondary | ICD-10-CM | POA: Diagnosis not present

## 2024-08-08 ENCOUNTER — Other Ambulatory Visit: Payer: Self-pay

## 2024-08-08 MED ORDER — ROSUVASTATIN CALCIUM 20 MG PO TABS: 20.0000 mg | ORAL_TABLET | Freq: Every day | ORAL | 3 refills | Status: AC

## 2024-08-14 ENCOUNTER — Telehealth: Payer: Self-pay | Admitting: Internal Medicine

## 2024-08-14 NOTE — Telephone Encounter (Signed)
 Left message for patient to callback.  Number provided in message invalid.

## 2024-08-14 NOTE — Telephone Encounter (Signed)
 Patient c/o Palpitations:  STAT if patient reporting lightheadedness, shortness of breath, or chest pain  How long have you had palpitations/irregular HR/ Afib? Are you having the symptoms now? A month/ not right now   Are you currently experiencing lightheadedness, SOB or CP? Pt denies any of these symptoms   Do you have a history of afib (atrial fibrillation) or irregular heart rhythm? Yes   Have you checked your BP or HR? (document readings if available): pt is not sure what it is    Are you experiencing any other symptoms? Pains in shoulders and arms    Best number [phone]

## 2024-08-21 NOTE — Telephone Encounter (Signed)
 Left message to call office.

## 2024-08-21 NOTE — Telephone Encounter (Signed)
 Left message to call back.

## 2024-08-21 NOTE — Telephone Encounter (Signed)
 Patient returned RN's call and stated can leave voice message.

## 2024-08-22 NOTE — Telephone Encounter (Signed)
Patient is returning call. Please advised

## 2024-08-22 NOTE — Telephone Encounter (Signed)
 Spoke with patient and she reports that she called last week for refills on a medication. Pt declines calling about palpitations. Is this supposed to be under a different patient's chart?

## 2024-08-22 NOTE — Telephone Encounter (Signed)
 Left message to call back.

## 2024-09-17 ENCOUNTER — Inpatient Hospital Stay (HOSPITAL_BASED_OUTPATIENT_CLINIC_OR_DEPARTMENT_OTHER)
Admission: EM | Admit: 2024-09-17 | Discharge: 2024-09-20 | DRG: 378 | Disposition: A | Discharge status: Home / Self Care | Attending: Internal Medicine | Admitting: Internal Medicine

## 2024-09-17 ENCOUNTER — Encounter (HOSPITAL_BASED_OUTPATIENT_CLINIC_OR_DEPARTMENT_OTHER): Payer: Self-pay | Admitting: Emergency Medicine

## 2024-09-17 ENCOUNTER — Emergency Department (HOSPITAL_BASED_OUTPATIENT_CLINIC_OR_DEPARTMENT_OTHER)

## 2024-09-17 ENCOUNTER — Other Ambulatory Visit: Payer: Self-pay

## 2024-09-17 DIAGNOSIS — R197 Diarrhea, unspecified: Secondary | ICD-10-CM | POA: Diagnosis present

## 2024-09-17 DIAGNOSIS — I10 Essential (primary) hypertension: Secondary | ICD-10-CM | POA: Diagnosis present

## 2024-09-17 DIAGNOSIS — D75839 Thrombocytosis, unspecified: Secondary | ICD-10-CM | POA: Diagnosis present

## 2024-09-17 DIAGNOSIS — K2100 Gastro-esophageal reflux disease with esophagitis, without bleeding: Secondary | ICD-10-CM | POA: Diagnosis present

## 2024-09-17 DIAGNOSIS — D649 Anemia, unspecified: Principal | ICD-10-CM

## 2024-09-17 DIAGNOSIS — D62 Acute posthemorrhagic anemia: Secondary | ICD-10-CM | POA: Diagnosis present

## 2024-09-17 DIAGNOSIS — R5381 Other malaise: Secondary | ICD-10-CM | POA: Diagnosis present

## 2024-09-17 DIAGNOSIS — R9389 Abnormal findings on diagnostic imaging of other specified body structures: Secondary | ICD-10-CM

## 2024-09-17 DIAGNOSIS — E876 Hypokalemia: Secondary | ICD-10-CM | POA: Diagnosis not present

## 2024-09-17 DIAGNOSIS — K2970 Gastritis, unspecified, without bleeding: Secondary | ICD-10-CM | POA: Diagnosis present

## 2024-09-17 DIAGNOSIS — Z23 Encounter for immunization: Secondary | ICD-10-CM

## 2024-09-17 DIAGNOSIS — I2510 Atherosclerotic heart disease of native coronary artery without angina pectoris: Secondary | ICD-10-CM | POA: Diagnosis present

## 2024-09-17 DIAGNOSIS — Z720 Tobacco use: Secondary | ICD-10-CM

## 2024-09-17 DIAGNOSIS — F17219 Nicotine dependence, cigarettes, with unspecified nicotine-induced disorders: Secondary | ICD-10-CM

## 2024-09-17 DIAGNOSIS — N179 Acute kidney failure, unspecified: Secondary | ICD-10-CM | POA: Diagnosis present

## 2024-09-17 DIAGNOSIS — H9193 Unspecified hearing loss, bilateral: Secondary | ICD-10-CM | POA: Diagnosis present

## 2024-09-17 DIAGNOSIS — K2980 Duodenitis without bleeding: Secondary | ICD-10-CM | POA: Diagnosis present

## 2024-09-17 DIAGNOSIS — K922 Gastrointestinal hemorrhage, unspecified: Principal | ICD-10-CM | POA: Diagnosis present

## 2024-09-17 DIAGNOSIS — K859 Acute pancreatitis without necrosis or infection, unspecified: Secondary | ICD-10-CM | POA: Diagnosis present

## 2024-09-17 DIAGNOSIS — K264 Chronic or unspecified duodenal ulcer with hemorrhage: Secondary | ICD-10-CM | POA: Diagnosis not present

## 2024-09-17 DIAGNOSIS — K449 Diaphragmatic hernia without obstruction or gangrene: Secondary | ICD-10-CM | POA: Diagnosis present

## 2024-09-17 DIAGNOSIS — E871 Hypo-osmolality and hyponatremia: Secondary | ICD-10-CM | POA: Diagnosis present

## 2024-09-17 DIAGNOSIS — E861 Hypovolemia: Secondary | ICD-10-CM | POA: Diagnosis present

## 2024-09-17 DIAGNOSIS — F419 Anxiety disorder, unspecified: Secondary | ICD-10-CM | POA: Diagnosis present

## 2024-09-17 DIAGNOSIS — Z7982 Long term (current) use of aspirin: Secondary | ICD-10-CM

## 2024-09-17 DIAGNOSIS — R748 Abnormal levels of other serum enzymes: Secondary | ICD-10-CM | POA: Diagnosis present

## 2024-09-17 DIAGNOSIS — K222 Esophageal obstruction: Secondary | ICD-10-CM | POA: Diagnosis present

## 2024-09-17 DIAGNOSIS — F17210 Nicotine dependence, cigarettes, uncomplicated: Secondary | ICD-10-CM | POA: Diagnosis present

## 2024-09-17 DIAGNOSIS — R531 Weakness: Secondary | ICD-10-CM

## 2024-09-17 DIAGNOSIS — E785 Hyperlipidemia, unspecified: Secondary | ICD-10-CM | POA: Diagnosis present

## 2024-09-17 DIAGNOSIS — Z79899 Other long term (current) drug therapy: Secondary | ICD-10-CM

## 2024-09-17 DIAGNOSIS — I7143 Infrarenal abdominal aortic aneurysm, without rupture: Secondary | ICD-10-CM | POA: Diagnosis present

## 2024-09-17 DIAGNOSIS — Z8249 Family history of ischemic heart disease and other diseases of the circulatory system: Secondary | ICD-10-CM

## 2024-09-17 LAB — CBC WITH DIFFERENTIAL/PLATELET
Abs Immature Granulocytes: 0.08 K/uL — ABNORMAL HIGH (ref 0.00–0.07)
Basophils Absolute: 0 K/uL (ref 0.0–0.1)
Basophils Relative: 0 %
Eosinophils Absolute: 0 K/uL (ref 0.0–0.5)
Eosinophils Relative: 0 %
HCT: 18.1 % — ABNORMAL LOW (ref 36.0–46.0)
Hemoglobin: 6 g/dL — CL (ref 12.0–15.0)
Immature Granulocytes: 1 %
Lymphocytes Relative: 14 %
Lymphs Abs: 1.1 K/uL (ref 0.7–4.0)
MCH: 31.6 pg (ref 26.0–34.0)
MCHC: 33.1 g/dL (ref 30.0–36.0)
MCV: 95.3 fL (ref 80.0–100.0)
Monocytes Absolute: 0.9 K/uL (ref 0.1–1.0)
Monocytes Relative: 12 %
Neutro Abs: 5.2 K/uL (ref 1.7–7.7)
Neutrophils Relative %: 73 %
Platelets: 581 K/uL — ABNORMAL HIGH (ref 150–400)
RBC: 1.9 MIL/uL — ABNORMAL LOW (ref 3.87–5.11)
RDW: 17.6 % — ABNORMAL HIGH (ref 11.5–15.5)
WBC: 7.3 K/uL (ref 4.0–10.5)
nRBC: 0.3 % — ABNORMAL HIGH (ref 0.0–0.2)

## 2024-09-17 LAB — COMPREHENSIVE METABOLIC PANEL WITH GFR
ALT: 13 U/L (ref 0–44)
AST: 22 U/L (ref 15–41)
Albumin: 4.2 g/dL (ref 3.5–5.0)
Alkaline Phosphatase: 36 U/L — ABNORMAL LOW (ref 38–126)
Anion gap: 15 (ref 5–15)
BUN: 23 mg/dL (ref 8–23)
CO2: 22 mmol/L (ref 22–32)
Calcium: 9 mg/dL (ref 8.9–10.3)
Chloride: 92 mmol/L — ABNORMAL LOW (ref 98–111)
Creatinine, Ser: 1.26 mg/dL — ABNORMAL HIGH (ref 0.44–1.00)
GFR, Estimated: 44 mL/min — ABNORMAL LOW (ref >60)
Glucose, Bld: 103 mg/dL — ABNORMAL HIGH (ref 70–99)
Potassium: 3.2 mmol/L — ABNORMAL LOW (ref 3.5–5.1)
Sodium: 128 mmol/L — ABNORMAL LOW (ref 135–145)
Total Bilirubin: 0.3 mg/dL (ref 0.0–1.2)
Total Protein: 6.9 g/dL (ref 6.5–8.1)

## 2024-09-17 LAB — URINALYSIS, ROUTINE W REFLEX MICROSCOPIC
Bilirubin Urine: NEGATIVE
Glucose, UA: NEGATIVE mg/dL
Hgb urine dipstick: NEGATIVE
Ketones, ur: NEGATIVE mg/dL
Leukocytes,Ua: NEGATIVE
Nitrite: NEGATIVE
Protein, ur: NEGATIVE mg/dL
Specific Gravity, Urine: 1.01 (ref 1.005–1.030)
pH: 7 (ref 5.0–8.0)

## 2024-09-17 LAB — LIPASE, BLOOD: Lipase: 129 U/L — ABNORMAL HIGH (ref 11–51)

## 2024-09-17 LAB — RETICULOCYTES
Immature Retic Fract: 27.2 % — ABNORMAL HIGH (ref 2.3–15.9)
RBC.: 1.66 MIL/uL — ABNORMAL LOW (ref 3.87–5.11)
Retic Count, Absolute: 312.4 K/uL — ABNORMAL HIGH (ref 19.0–186.0)
Retic Ct Pct: 18.8 % — ABNORMAL HIGH (ref 0.4–3.1)

## 2024-09-17 LAB — MAGNESIUM: Magnesium: 2.4 mg/dL (ref 1.7–2.4)

## 2024-09-17 LAB — TROPONIN T, HIGH SENSITIVITY
Troponin T High Sensitivity: 21 ng/L — ABNORMAL HIGH (ref 0–19)
Troponin T High Sensitivity: 19 ng/L (ref 0–19)

## 2024-09-17 LAB — CK: Total CK: 69 U/L (ref 38–234)

## 2024-09-17 LAB — OCCULT BLOOD X 1 CARD TO LAB, STOOL: Fecal Occult Bld: POSITIVE — AB

## 2024-09-17 MED ORDER — SODIUM CHLORIDE 0.9 % IV SOLN: INTRAVENOUS | Status: DC

## 2024-09-17 MED ORDER — PANTOPRAZOLE SODIUM 40 MG IV SOLR
40.0000 mg | Freq: Two times a day (BID) | INTRAVENOUS | Status: DC
  Administered 2024-09-18 – 2024-09-20 (×5): 40 mg via INTRAVENOUS
  Filled 2024-09-17 (×5): qty 10

## 2024-09-17 MED ORDER — SODIUM CHLORIDE 0.9 % IV BOLUS
1000.0000 mL | Freq: Once | INTRAVENOUS | Status: AC
  Administered 2024-09-17: 1000 mL via INTRAVENOUS

## 2024-09-17 MED ORDER — POTASSIUM CHLORIDE CRYS ER 20 MEQ PO TBCR
40.0000 meq | EXTENDED_RELEASE_TABLET | Freq: Once | ORAL | Status: AC
  Administered 2024-09-17: 40 meq via ORAL
  Filled 2024-09-17: qty 2

## 2024-09-17 MED ORDER — PANTOPRAZOLE SODIUM 40 MG IV SOLR
80.0000 mg | Freq: Once | INTRAVENOUS | Status: AC
  Administered 2024-09-17: 80 mg via INTRAVENOUS
  Filled 2024-09-17: qty 20

## 2024-09-17 NOTE — ED Notes (Signed)
 [facility] called for transport.

## 2024-09-17 NOTE — Plan of Care (Addendum)
 Plan of Care Note for accepted transfer  Patient: Alice White    FMW:[id number]  DOA: 09/17/2024     Facility requesting transfer: [facility]  Requesting Provider: Elnor Jayson LABOR, DO  Reason for transfer: UGIB, severe symptomatic anemia  Facility course:   51 F hx CAD, HLD, HTN, vertigo, p/w weakness. + recent melena. On ASA, no AC. Hemodynamically stable. Lab most notable HB 6 (down from 13 when last checked 2/'25), other cell lines OK, retic 18%. Other labs Na 128, K 3.2, Cr 1.2, HS trop 21 suspect demand from anemia. EDP messaged GI Dr. Burnette, Margarete to consult in the AM. Treated with Pantoprazole 80 mg IV, will continue 40 mg IV BID., and also received 1 L NS, 40 meq kcl. Has not received blood yet; advised to give 1 U emergency release blood as no immediate progressive bed availability. Spoke with EDP, will send ED -> ED to facilitate faster transfusion and will hold on emergency release for now.     Plan of care: ED -> ED to [facility].  The patient is accepted for admission to Progressive unit, at [facility].    Author: Dorn Dawson, MD  09/17/2024  Check [email or web address] for on-call coverage.  Nursing staff, Please call [facility] Admits & Consults System-Wide number on Amion as soon as patient's arrival, so appropriate admitting provider can evaluate the pt.

## 2024-09-17 NOTE — ED Provider Notes (Signed)
 [place] EMERGENCY DEPARTMENT AT [facility] Provider Note  CSN: [id number] Arrival date & time: 09/17/24 1902  Chief Complaint(s) Weakness  HPI Alice White is a 73 y.o. female with past medical history as below, significant for CAD, HLD, HTN, vertigo who presents to the ED with complaint of weakness  Patient reports she recently developed a diarrheal illness, she was having some black stool transiently.  She is also having nausea and vomiting.  Poor p.o. intake.  The symptoms have since subsided the patient reports that she has had ongoing poor p.o. intake, poor appetite.  No longer having any vomiting or diarrhea.  Had regular bowel movement this morning which was brown.  No fevers or chills.  Patient does report significant fatigue over the past few days, she gets tired very quickly when ambulating.  So much so that she has she has started using a walker today.  No chest pain or dyspnea.  No fevers or chills past 24 hours.  No chest pain, dyspnea, or palpitations.    Past Medical History Past Medical History:  Diagnosis Date   AAA (abdominal aortic aneurysm)    Anxiety    Coronary artery disease involving native coronary artery of native heart without angina pectoris 07/03/2024   Hyperlipidemia    Hypertension    Vertigo    Patient Active Problem List   Diagnosis Date Noted   Coronary artery disease involving native coronary artery of native heart without angina pectoris 07/03/2024   Infrarenal abdominal aortic aneurysm (AAA) without rupture 07/03/2024   Ascending aorta dilatation 07/03/2024   Abnormal CT of the chest 07/08/2023   Nicotine dependence 04/16/2020   Hemorrhoids, internal 09/02/2016   Thrombocytosis 05/01/2014   Abnormal ECG 03/07/2014   HTN (hypertension) 03/19/2012   Hyperlipidemia 03/08/2012   Home Medication(s) Prior to Admission medications   Medication Sig Start Date End Date Taking? Authorizing Provider  ALPRAZolam (XANAX) 0.25 MG tablet  Take 0.25 mg by mouth daily as needed. 01/02/20   [provider]  amLODipine (NORVASC) 5 MG tablet amlodipine 5 mg tablet 12/05/19   [provider]  aspirin 81 MG chewable tablet Chew by mouth.    [provider]  fenofibrate 160 MG tablet  03/24/20   [provider]  hydrochlorothiazide (HYDRODIURIL) 25 MG tablet Take 25 mg by mouth daily. 03/24/20   [provider]  ibandronate (BONIVA) 150 MG tablet Take 150 mg by mouth every 30 (thirty) days. 04/06/22   [provider]  meclizine (ANTIVERT) 50 MG tablet Take by mouth.    [provider]  Multiple Vitamins-Minerals (MULTIVITAMIN WITH MINERALS) tablet Take by mouth.    [provider]  Omega-3 Fatty Acids (FISH OIL) 1000 MG CAPS Take by mouth.    [provider]  rosuvastatin  (CRESTOR ) 20 MG tablet Take 1 tablet (20 mg total) by mouth daily. 08/08/24   Patwardhan, Newman PARAS, MD  Past Surgical History Past Surgical History:  Procedure Laterality Date   CESAREAN SECTION     x 2   Family History Family History  Problem Relation Age of Onset   Heart disease Mother    Hypertension Mother    Heart disease Father     Social History Social History   Tobacco Use   Smoking status: Every Day    Current packs/day: 0.50    Average packs/day: 0.5 packs/day for 54.0 years (27.0 ttl pk-yrs)    Types: Cigarettes    Passive exposure: Never   Smokeless tobacco: Never  Vaping Use   Vaping status: Never Used  Substance Use Topics   Alcohol use: Not Currently   Drug use: Never   Allergies Patient has no known allergies.  Review of Systems A thorough review of systems was obtained and all systems are negative except as noted in the HPI and PMH.   Physical Exam Vital Signs  I have reviewed the triage vital signs BP 107/77 (BP Location:  Left Arm)   Pulse 91   Temp 98 F (36.7 C) (Oral)   Resp 14   Ht 5' 3 (1.6 m)   Wt 53.5 kg   SpO2 100%   BMI 20.90 kg/m  Physical Exam Vitals and nursing note reviewed. Exam conducted with a chaperone present ([place]).  Constitutional:      General: She is not in acute distress.    Appearance: Normal appearance. She is well-developed. She is not ill-appearing.  HENT:     Head: Normocephalic and atraumatic.     Comments: Tongue is pale Conjunctival pallor    Right Ear: External ear normal.     Left Ear: External ear normal.     Nose: Nose normal.     Mouth/Throat:     Mouth: Mucous membranes are dry.  Eyes:     General: No scleral icterus.       Right eye: No discharge.        Left eye: No discharge.  Cardiovascular:     Rate and Rhythm: Normal rate and regular rhythm.     Pulses: Normal pulses.     Heart sounds: Normal heart sounds. No murmur heard. Pulmonary:     Effort: Pulmonary effort is normal. No respiratory distress.     Breath sounds: No stridor.  Abdominal:     General: Abdomen is flat. There is no distension.     Palpations: Abdomen is soft.     Tenderness: There is no abdominal tenderness. There is no guarding.  Genitourinary:    Comments: External hemorrhoids noted, not thrombosed.  No frank bleeding.  Melena noted in rectal vault Musculoskeletal:        General: No deformity.     Cervical back: No rigidity.  Skin:    General: Skin is warm and dry.     Capillary Refill: Capillary refill takes less than 2 seconds.     Coloration: Skin is pale. Skin is not cyanotic or jaundiced.  Neurological:     Mental Status: She is alert and oriented to person, place, and time.     GCS: GCS eye subscore is 4. GCS verbal subscore is 5. GCS motor subscore is 6.     Cranial Nerves: Cranial nerves 2-12 are intact. No dysarthria or facial asymmetry.     Sensory: Sensation is intact.     Motor: Motor function is intact. No tremor.     Coordination: Coordination  normal.  Psychiatric:  Speech: Speech normal.        Behavior: Behavior normal. Behavior is cooperative.     ED Results and Treatments Labs (all labs ordered are listed, but only abnormal results are displayed) Labs Reviewed  COMPREHENSIVE METABOLIC PANEL WITH GFR - Abnormal; Notable for the following components:      Result Value   Sodium 128 (*)    Potassium 3.2 (*)    Chloride 92 (*)    Glucose, Bld 103 (*)    Creatinine, Ser 1.26 (*)    Alkaline Phosphatase 36 (*)    GFR, Estimated 44 (*)    All other components within normal limits  CBC WITH DIFFERENTIAL/PLATELET - Abnormal; Notable for the following components:   RBC 1.90 (*)    Hemoglobin 6.0 (*)    HCT 18.1 (*)    RDW 17.6 (*)    Platelets 581 (*)    nRBC 0.3 (*)    Abs Immature Granulocytes 0.08 (*)    All other components within normal limits  LIPASE, BLOOD - Abnormal; Notable for the following components:   Lipase 129 (*)    All other components within normal limits  RETICULOCYTES - Abnormal; Notable for the following components:   Retic Ct Pct 18.8 (*)    RBC. 1.66 (*)    Retic Count, Absolute 312.4 (*)    Immature Retic Fract 27.2 (*)    All other components within normal limits  OCCULT BLOOD X 1 CARD TO LAB, STOOL - Abnormal; Notable for the following components:   Fecal Occult Bld POSITIVE (*)    All other components within normal limits  TROPONIN T, HIGH SENSITIVITY - Abnormal; Notable for the following components:   Troponin T High Sensitivity 21 (*)    All other components within normal limits  MAGNESIUM  CK  URINALYSIS, ROUTINE W REFLEX MICROSCOPIC  TSH  T4, FREE  VITAMIN B12  FOLATE  IRON AND TIBC  FERRITIN  TROPONIN T, HIGH SENSITIVITY                                                                                                                          Radiology DG Chest Port 1 View Result Date: 09/17/2024 CLINICAL DATA:  Generalized weakness EXAM: PORTABLE CHEST 1 VIEW  COMPARISON:  10/22/2023 CT FINDINGS: Cardiac shadow is within normal limits. Aortic calcifications are noted. Lungs are well aerated bilaterally. No focal infiltrate or effusion is seen. IMPRESSION: No active disease. Electronically Signed   By: Oneil Devonshire M.D.   On: 09/17/2024 20:34    Pertinent labs & imaging results that were available during my care of the patient were reviewed by me and considered in my medical decision making (see MDM for details).  Medications Ordered in ED Medications  pantoprazole (PROTONIX) injection 80 mg (has no administration in time range)  potassium chloride SA (KLOR-CON M) CR tablet 40 mEq (has no administration in time range)  sodium chloride 0.9 % bolus 1,000 mL (1,000 mLs  Intravenous New Bag/Given 09/17/24 2015)                                                                                                                                     Procedures .Critical Care  Performed by: Elnor Jayson LABOR, DO Authorized by: Elnor Jayson LABOR, DO   Critical care provider statement:    Critical care time (minutes):  30   Critical care time was exclusive of:  Separately billable procedures and treating other patients   Critical care was necessary to treat or prevent imminent or life-threatening deterioration of the following conditions:  Circulatory failure and metabolic crisis   Critical care was time spent personally by me on the following activities:  Development of treatment plan with patient or surrogate, discussions with consultants, evaluation of patient's response to treatment, examination of patient, ordering and review of laboratory studies, ordering and review of radiographic studies, ordering and performing treatments and interventions, pulse oximetry, re-evaluation of patient's condition, review of old charts and obtaining history from patient or surrogate   Care discussed with: admitting provider     (including critical care time)  Medical Decision  Making / ED Course    Medical Decision Making:    NIRVI BOEHLER is a 73 y.o. female with past medical history as below, significant for CAD, HLD, HTN, vertigo who presents to the ED with complaint of weakness. The complaint involves an extensive differential diagnosis and also carries with it a high risk of complications and morbidity.  Serious etiology was considered. Ddx includes but is not limited to: Dehydration, anemia, metabolic derangement, infection, ACS, arrhythmia, viral syndrome, etc.  Complete initial physical exam performed, notably the patient was in no acute distress, resting comfortably on stretcher.    Reviewed and confirmed nursing documentation for past medical history, family history, social history.  Vital signs reviewed.    Multifactorial weakness Symptomatic anemia Hyponatremia Hypokalemia AKI> - Patient with generalized malaise, increased fatigue following diarrheal illness.  She also reports cramping to her arms and legs when ambulating. - Patient with acute anemia, reports melanotic stools over the past week has not improved.  Patient with apparent melena on exam mixed with brown stool.  She is HDS. - She also has hyponatremia, hypokalemia, she has AKI. - Patient has previously seen GI at [facility] >> Dr outlaw notified - Patient reports admission for symptomatic anemia and other findings as above.  She is agreeable.  Will discuss with [facility]  UGIB > - Hemoglobin 13.4> 6 - Heme positive, melena on exam. - she takes aspirin - Provided Protonix. - Patient will need PRBC - GI Dr Burnette notified of consult  Clinical Course as of 09/17/24 2119  [place] Sep 17, 2024  2039 Hemoglobin(!!): 6.0 Patient reports she did have black-colored stool a few days ago which has since turned into brown stool. [SG]    Clinical Course User Index [SG] Elnor Jayson LABOR,  DO     Admit [facility]               Additional history obtained: -Additional history obtained from  family -External records from outside source obtained and reviewed including: Chart review including previous notes, labs, imaging, consultation notes including  Prior labs, medications   Lab Tests: -I ordered, reviewed, and interpreted labs.   The pertinent results include:   Labs Reviewed  COMPREHENSIVE METABOLIC PANEL WITH GFR - Abnormal; Notable for the following components:      Result Value   Sodium 128 (*)    Potassium 3.2 (*)    Chloride 92 (*)    Glucose, Bld 103 (*)    Creatinine, Ser 1.26 (*)    Alkaline Phosphatase 36 (*)    GFR, Estimated 44 (*)    All other components within normal limits  CBC WITH DIFFERENTIAL/PLATELET - Abnormal; Notable for the following components:   RBC 1.90 (*)    Hemoglobin 6.0 (*)    HCT 18.1 (*)    RDW 17.6 (*)    Platelets 581 (*)    nRBC 0.3 (*)    Abs Immature Granulocytes 0.08 (*)    All other components within normal limits  LIPASE, BLOOD - Abnormal; Notable for the following components:   Lipase 129 (*)    All other components within normal limits  RETICULOCYTES - Abnormal; Notable for the following components:   Retic Ct Pct 18.8 (*)    RBC. 1.66 (*)    Retic Count, Absolute 312.4 (*)    Immature Retic Fract 27.2 (*)    All other components within normal limits  OCCULT BLOOD X 1 CARD TO LAB, STOOL - Abnormal; Notable for the following components:   Fecal Occult Bld POSITIVE (*)    All other components within normal limits  TROPONIN T, HIGH SENSITIVITY - Abnormal; Notable for the following components:   Troponin T High Sensitivity 21 (*)    All other components within normal limits  MAGNESIUM  CK  URINALYSIS, ROUTINE W REFLEX MICROSCOPIC  TSH  T4, FREE  VITAMIN B12  FOLATE  IRON AND TIBC  FERRITIN  TROPONIN T, HIGH SENSITIVITY    Notable for as above  EKG   EKG Interpretation Date/Time:    Ventricular Rate:    PR Interval:    QRS Duration:    QT Interval:    QTC Calculation:   R Axis:      Text  Interpretation:           Imaging Studies ordered: I ordered imaging studies including CXR I independently visualized the following imaging with scope of interpretation limited to determining acute life threatening conditions related to emergency care; findings noted above I agree with the radiologist interpretation If any imaging was obtained with contrast I closely monitored patient for any possible adverse reaction a/w contrast administration in the emergency department   Medicines ordered and prescription drug management: Meds ordered this encounter  Medications   sodium chloride 0.9 % bolus 1,000 mL   pantoprazole (PROTONIX) injection 80 mg   potassium chloride SA (KLOR-CON M) CR tablet 40 mEq    -I have reviewed the patients home medicines and have made adjustments as needed   Consultations Obtained: I requested consultation with the GI, [facility],  and discussed lab and imaging findings as well as pertinent plan   Cardiac Monitoring: The patient was maintained on a cardiac monitor.  I personally viewed and interpreted the cardiac monitored which showed an  underlying rhythm of: nsr Continuous pulse oximetry interpreted by myself, 100% on RA.    Social Determinants of Health:  Diagnosis or treatment significantly limited by social determinants of health: current smoker   Reevaluation: After the interventions noted above, I reevaluated the patient and found that they have improved  Co morbidities that complicate the patient evaluation  Past Medical History:  Diagnosis Date   AAA (abdominal aortic aneurysm)    Anxiety    Coronary artery disease involving native coronary artery of native heart without angina pectoris 07/03/2024   Hyperlipidemia    Hypertension    Vertigo       Dispostion: Disposition decision including need for hospitalization was considered, and patient admitted to the hospital.    Final Clinical Impression(s) / ED Diagnoses Final diagnoses:   Symptomatic anemia  Hyponatremia  Hypokalemia  AKI (acute kidney injury)  Weakness        Elnor Jayson LABOR, DO 09/17/24 2119

## 2024-09-17 NOTE — ED Triage Notes (Signed)
 Generalized weakness, fatigue, decreased po intake x 1 week. More winded with normal activity. Reports possible stomach virus during this week period where she had constipation and vomiting. Endorses bloody emesis and stool.

## 2024-09-17 NOTE — ED Notes (Signed)
 EDP and primary RN notified of Hgb 6.0.

## 2024-09-18 DIAGNOSIS — K2970 Gastritis, unspecified, without bleeding: Secondary | ICD-10-CM | POA: Diagnosis present

## 2024-09-18 DIAGNOSIS — I2510 Atherosclerotic heart disease of native coronary artery without angina pectoris: Secondary | ICD-10-CM | POA: Diagnosis present

## 2024-09-18 DIAGNOSIS — K2980 Duodenitis without bleeding: Secondary | ICD-10-CM | POA: Diagnosis present

## 2024-09-18 DIAGNOSIS — Z79899 Other long term (current) drug therapy: Secondary | ICD-10-CM | POA: Diagnosis not present

## 2024-09-18 DIAGNOSIS — N179 Acute kidney failure, unspecified: Secondary | ICD-10-CM

## 2024-09-18 DIAGNOSIS — R748 Abnormal levels of other serum enzymes: Secondary | ICD-10-CM

## 2024-09-18 DIAGNOSIS — D62 Acute posthemorrhagic anemia: Secondary | ICD-10-CM

## 2024-09-18 DIAGNOSIS — R197 Diarrhea, unspecified: Secondary | ICD-10-CM | POA: Diagnosis present

## 2024-09-18 DIAGNOSIS — E876 Hypokalemia: Secondary | ICD-10-CM

## 2024-09-18 DIAGNOSIS — K264 Chronic or unspecified duodenal ulcer with hemorrhage: Secondary | ICD-10-CM | POA: Diagnosis present

## 2024-09-18 DIAGNOSIS — K2100 Gastro-esophageal reflux disease with esophagitis, without bleeding: Secondary | ICD-10-CM | POA: Diagnosis present

## 2024-09-18 DIAGNOSIS — Z8249 Family history of ischemic heart disease and other diseases of the circulatory system: Secondary | ICD-10-CM | POA: Diagnosis not present

## 2024-09-18 DIAGNOSIS — K449 Diaphragmatic hernia without obstruction or gangrene: Secondary | ICD-10-CM | POA: Diagnosis present

## 2024-09-18 DIAGNOSIS — E871 Hypo-osmolality and hyponatremia: Secondary | ICD-10-CM | POA: Diagnosis present

## 2024-09-18 DIAGNOSIS — F17210 Nicotine dependence, cigarettes, uncomplicated: Secondary | ICD-10-CM | POA: Diagnosis present

## 2024-09-18 DIAGNOSIS — I10 Essential (primary) hypertension: Secondary | ICD-10-CM | POA: Diagnosis present

## 2024-09-18 DIAGNOSIS — H9193 Unspecified hearing loss, bilateral: Secondary | ICD-10-CM | POA: Diagnosis present

## 2024-09-18 DIAGNOSIS — I7143 Infrarenal abdominal aortic aneurysm, without rupture: Secondary | ICD-10-CM | POA: Diagnosis present

## 2024-09-18 DIAGNOSIS — K922 Gastrointestinal hemorrhage, unspecified: Secondary | ICD-10-CM | POA: Diagnosis not present

## 2024-09-18 DIAGNOSIS — K859 Acute pancreatitis without necrosis or infection, unspecified: Secondary | ICD-10-CM | POA: Diagnosis present

## 2024-09-18 DIAGNOSIS — E861 Hypovolemia: Secondary | ICD-10-CM | POA: Diagnosis present

## 2024-09-18 DIAGNOSIS — K222 Esophageal obstruction: Secondary | ICD-10-CM | POA: Diagnosis present

## 2024-09-18 DIAGNOSIS — D75839 Thrombocytosis, unspecified: Secondary | ICD-10-CM | POA: Diagnosis present

## 2024-09-18 DIAGNOSIS — E785 Hyperlipidemia, unspecified: Secondary | ICD-10-CM | POA: Diagnosis present

## 2024-09-18 DIAGNOSIS — Z23 Encounter for immunization: Secondary | ICD-10-CM | POA: Diagnosis present

## 2024-09-18 LAB — FERRITIN: Ferritin: 44 ng/mL (ref 11–307)

## 2024-09-18 LAB — BASIC METABOLIC PANEL WITH GFR
Anion gap: 11 (ref 5–15)
BUN: 18 mg/dL (ref 8–23)
CO2: 20 mmol/L — ABNORMAL LOW (ref 22–32)
Calcium: 8.6 mg/dL — ABNORMAL LOW (ref 8.9–10.3)
Chloride: 101 mmol/L (ref 98–111)
Creatinine, Ser: 1.03 mg/dL — ABNORMAL HIGH (ref 0.44–1.00)
GFR, Estimated: 56 mL/min — ABNORMAL LOW (ref >60)
Glucose, Bld: 91 mg/dL (ref 70–99)
Potassium: 4.2 mmol/L (ref 3.5–5.1)
Sodium: 132 mmol/L — ABNORMAL LOW (ref 135–145)

## 2024-09-18 LAB — T4, FREE: Free T4: 1.04 ng/dL (ref 0.61–1.12)

## 2024-09-18 LAB — CBC
HCT: 17.2 % — ABNORMAL LOW (ref 36.0–46.0)
Hemoglobin: 5.2 g/dL — CL (ref 12.0–15.0)
MCH: 30.1 pg (ref 26.0–34.0)
MCHC: 30.2 g/dL (ref 30.0–36.0)
MCV: 99.4 fL (ref 80.0–100.0)
Platelets: 495 K/uL — ABNORMAL HIGH (ref 150–400)
RBC: 1.73 MIL/uL — ABNORMAL LOW (ref 3.87–5.11)
RDW: 17.4 % — ABNORMAL HIGH (ref 11.5–15.5)
WBC: 7.7 K/uL (ref 4.0–10.5)
nRBC: 0.3 % — ABNORMAL HIGH (ref 0.0–0.2)

## 2024-09-18 LAB — IRON AND TIBC
Iron: 21 ug/dL — ABNORMAL LOW (ref 28–170)
Saturation Ratios: 5 % — ABNORMAL LOW (ref 10.4–31.8)
TIBC: 413 ug/dL (ref 250–450)
UIBC: 392 ug/dL

## 2024-09-18 LAB — ABO/RH: ABO/RH(D): B POS

## 2024-09-18 LAB — PROTIME-INR
INR: 1.2 (ref 0.8–1.2)
Prothrombin Time: 15.5 s — ABNORMAL HIGH (ref 11.4–15.2)

## 2024-09-18 LAB — LIPASE, BLOOD: Lipase: 134 U/L — ABNORMAL HIGH (ref 11–51)

## 2024-09-18 LAB — HEMOGLOBIN AND HEMATOCRIT, BLOOD
HCT: 29.8 % — ABNORMAL LOW (ref 36.0–46.0)
Hemoglobin: 9.3 g/dL — ABNORMAL LOW (ref 12.0–15.0)

## 2024-09-18 LAB — VITAMIN B12: Vitamin B-12: 1397 pg/mL — ABNORMAL HIGH (ref 180–914)

## 2024-09-18 LAB — FOLATE: Folate: >20 ng/mL (ref >5.9)

## 2024-09-18 LAB — TSH: TSH: 2.327 u[IU]/mL (ref 0.350–4.500)

## 2024-09-18 LAB — PHOSPHORUS: Phosphorus: 2.5 mg/dL (ref 2.5–4.6)

## 2024-09-18 LAB — PREPARE RBC (CROSSMATCH)

## 2024-09-18 LAB — OSMOLALITY: Osmolality: 284 mosm/kg (ref 275–295)

## 2024-09-18 MED ORDER — ACETAMINOPHEN 650 MG RE SUPP: 650.0000 mg | Freq: Four times a day (QID) | RECTAL | Status: DC | PRN

## 2024-09-18 MED ORDER — ENSURE PLUS HIGH PROTEIN PO LIQD: 237.0000 mL | Freq: Two times a day (BID) | ORAL | Status: DC

## 2024-09-18 MED ORDER — INFLUENZA VAC SPLIT HIGH-DOSE 0.5 ML IM SUSY
0.5000 mL | PREFILLED_SYRINGE | INTRAMUSCULAR | Status: AC
  Administered 2024-09-19: 0.5 mL via INTRAMUSCULAR
  Filled 2024-09-18: qty 0.5

## 2024-09-18 MED ORDER — ACETAMINOPHEN 325 MG PO TABS: 650.0000 mg | ORAL_TABLET | Freq: Four times a day (QID) | ORAL | Status: DC | PRN

## 2024-09-18 MED ORDER — NICOTINE 14 MG/24HR TD PT24
14.0000 mg | MEDICATED_PATCH | Freq: Every day | TRANSDERMAL | Status: DC
Start: 2024-09-18 — End: 2024-09-20
  Administered 2024-09-18 – 2024-09-20 (×3): 14 mg via TRANSDERMAL
  Filled 2024-09-18 (×3): qty 1

## 2024-09-18 MED ORDER — SODIUM CHLORIDE 0.9% IV SOLUTION: Freq: Once | INTRAVENOUS | Status: AC

## 2024-09-18 MED ORDER — SODIUM CHLORIDE 0.9 % IV SOLN: INTRAVENOUS | Status: DC

## 2024-09-18 MED ORDER — SODIUM CHLORIDE 0.9 % IV SOLN
INTRAVENOUS | Status: AC
Start: 2024-09-18 — End: 2024-09-19

## 2024-09-18 NOTE — Progress Notes (Signed)
   09/18/24 1356  TOC Brief Assessment  Insurance and Status Reviewed  Patient has primary care physician Yes  Home environment has been reviewed Resides alone in single family home  Prior level of function: Independent with ADLs at baseline  Prior/Current Home Services No current home services  Social Drivers of Health Review SDOH reviewed no interventions necessary  Readmission risk has been reviewed Yes  Transition of care needs no transition of care needs at this time

## 2024-09-18 NOTE — Progress Notes (Addendum)
 PROGRESS NOTE    Alice White  FMW:[id number] DOB: 27-Dec-1950 DOA: 09/17/2024 PCP: Gladystine Erminio CROME, MD    Brief Narrative:   Alice White is a 73 y.o. female with past medical history significant for HTN, HLD, CAD, AAA, thrombocytosis, anxiety, bilateral hearing loss, vertigo, tobacco use disorder who presented to [facility] ED on 09/17/2024 with complaints of generalized weakness.  Patient reports history of dizziness, nausea, and vomiting a week ago which she attributed to vertigo.  Also at that time she had black-colored stools/diarrhea for about 2 days.  No hematemesis.  Nausea, vomiting, and diarrhea resolved but her appetite remained poor.  She did not have another bowel movement until yesterday which was no longer black in color but still dark brown.  She denies having any abdominal pain since the onset of her symptoms a week ago.  Reporting generalized weakness/fatigue.  Denies history of pancreatitis or prior GI bleed.  She never had an EGD done in the past.  Her last colonoscopy was in February 2023 at [facility] and she was told that she will need another one done in 5 years due to having polyps.  Denies alcohol use.  She takes aspirin 81 mg daily but not on anticoagulation.  Denies regular NSAID use.  She smokes 10 to 15 cigarettes a day.   In the ED, temperature 98.0 F, HR 91, RR 14, BP 107/77, SpO2 100% on room air.  Hemoglobin 6.0, MCV 95.3, platelet count 581k, sodium 128, potassium 3.2, chloride 92, creatinine 1.26, no elevation of LFTs, lipase 129, troponin 21> 19, TSH and free T4 pending, magnesium 2.4, CK normal, anemia panel pending, FOBT positive, UA not suggestive of infection.  Chest x-ray showing no active disease.  Patient was given IV Protonix 80 mg, oral potassium 40 mEq, and 1 L normal saline bolus.  [facility] gastroenterology consulted.  TRH consulted for admission.    Assessment & Plan:   Acute upper GI bleed/melena Symptomatic acute blood loss anemia Patient  presenting with generalized weakness, dark tarry stools over the last 2 weeks.  On aspirin at baseline.  No other anticoagulant/antiplatelet use, denies NSAID abuse.  Hemoglobin 6.0 (previously 13.4 in February 2025).  FOBT positive.  Anemia panel with iron 21, TIBC 413, ferritin 44, folate greater than 20.0, vitamin B12 1397. -- [facility] following, appreciate assistance -- Hgb 6.0>5.2 -- Protonix 40 mg IV every 12 hours -- Transfuse 2 unit PBC's today -- H&H 2 hours following transfusion -- Continue to monitor hemoglobin every 8 hours thereafter -- Transfuse for hemoglobin less than 7.0 -- Holding aspirin -- Soft diet, n.p.o. after midnight for planned EGD 10/14  AKI Creatinine 1.26 on admission.  Likely prerenal in etiology in the setting of acute GI bleed.  Previously 0.79 in September 2024.   -- Cr 1.26>1.03 -- Continue IVF -- Holding home hydrochlorothiazide -- BMP in a.m.   Elevated lipase Question acute pancreatitis but vomiting has resolved and no abdominal pain or tenderness.   -- Continue IV fluid hydration and trend lipase. -- If lipase remains elevated, consider CT imaging abdomen   Hyponatremia Likely due to poor p.o. intake in the setting of recent vomiting.   -- Na 128>132 -- IVF  -- BMP am   Hypokalemia Repleted --Repeat electrolytes in a.m.   Chronic thrombocytosis Seen by outpatient hematology and evaluation in the past came back negative for JAK2, MPL, and CALR.  Thrombocytosis was felt to be reactive and likely related to tobacco abuse.  Platelet  count appears to be higher than baseline.   -- Continue to monitor CBC and encourage smoking cessation.    CAD Troponin minimally elevated and stable, not consistent with ACS.  Patient is not endorsing chest pain. -- Monitor on telemetry   Cigarette smoking Counseled on need for complete cessation/abstinence. -- Nicotine patch   Hypertension At baseline on amlodipine 5 mg p.o. daily, hydrochlorothiazide 25 mg  p.o. daily, -- Holding antihypertensives at this time given concern for acute GI bleed/risk of hypotension.   Hyperlipidemia --Holding home Crestor , fenofibrate   DVT prophylaxis: SCDs Start: 09/18/24 0230    Code Status: Full Code Family Communication: No family present at bedside this morning  Disposition Plan:  Level of care: Progressive Status is: Inpatient Remains inpatient appropriate because: Blood transfusion, pending EGD    Consultants:  [facility]  Procedures:  None  Antimicrobials:  None   Subjective: Patient seen examined bedside, lying in bed.  Currently receiving first unit of blood.  No specific complaints.  Denies any further bowel movements or bleeding since arrival to the floor.  Seen by GI this morning with plans for EGD tomorrow morning.  No other concerns or questions at this time.  Denies headache, no current dizziness, no fever/chills/night sweats, no nausea/vomiting/diarrhea, no chest pain, no palpitations, no shortness of breath, no abdominal pain, no focal weakness, no fatigue, no paresthesias.  No acute events overnight per nursing staff.  Objective: Vitals:   09/18/24 0529 09/18/24 0814 09/18/24 0907 09/18/24 0938  BP: (!) 101/58 121/60 113/60 120/77  Pulse: 78 79 75 80  Resp: 18 16 20  (!) 22  Temp: 98.7 F (37.1 C) 98.4 F (36.9 C) 98.9 F (37.2 C) 98.4 F (36.9 C)  TempSrc:  Oral Oral   SpO2:  98% 96%   Weight:      Height:        Intake/Output Summary (Last 24 hours) at 09/18/2024 1304 Last data filed at 09/18/2024 0811 Gross per 24 hour  Intake 1658.44 ml  Output --  Net 1658.44 ml   Filed Weights   09/17/24 1915  Weight: 53.5 kg    Examination:  Physical Exam: GEN: NAD, alert and oriented x 3, wd/wn HEENT: NCAT, PERRL, EOMI, sclera clear, MMM PULM: CTAB w/o wheezes/crackles, normal respiratory effort, on room air CV: RRR w/o M/G/R GI: abd soft, NTND, + BS MSK: no peripheral edema, moves all extremities  independently NEURO: CN II-XII intact, no focal deficits, sensation to light touch intact PSYCH: normal mood/affect Integumentary: dry/intact, no rashes or wounds    Data Reviewed: I have personally reviewed following labs and imaging studies  CBC: Recent Labs  Lab 09/17/24 2006 09/18/24 0235  WBC 7.3 7.7  NEUTROABS 5.2  --   HGB 6.0* 5.2*  HCT 18.1* 17.2*  MCV 95.3 99.4  PLT 581* 495*   Basic Metabolic Panel: Recent Labs  Lab 09/17/24 2006 09/18/24 0235  NA 128* 132*  K 3.2* 4.2  CL 92* 101  CO2 22 20*  GLUCOSE 103* 91  BUN 23 18  CREATININE 1.26* 1.03*  CALCIUM  9.0 8.6*  MG 2.4  --   PHOS  --  2.5   GFR: Estimated Creatinine Clearance: 39 mL/min (A) (by C-G formula based on SCr of 1.03 mg/dL (H)). Liver Function Tests: Recent Labs  Lab 09/17/24 2006  AST 22  ALT 13  ALKPHOS 36*  BILITOT 0.3  PROT 6.9  ALBUMIN 4.2   Recent Labs  Lab 09/17/24 2006 09/18/24 0235  LIPASE  129* 134*   No results for input(s): AMMONIA in the last 168 hours. Coagulation Profile: Recent Labs  Lab 09/18/24 0235  INR 1.2   Cardiac Enzymes: Recent Labs  Lab 09/17/24 2006  CKTOTAL 69   BNP (last 3 results) No results for input(s): PROBNP in the last 8760 hours. HbA1C: No results for input(s): HGBA1C in the last 72 hours. CBG: No results for input(s): GLUCAP in the last 168 hours. Lipid Profile: No results for input(s): CHOL, HDL, LDLCALC, TRIG, CHOLHDL, LDLDIRECT in the last 72 hours. Thyroid Function Tests: Recent Labs    09/17/24 2006  TSH 2.327  FREET4 1.04   Anemia Panel: Recent Labs    09/17/24 2041  VITAMINB12 1,397*  FOLATE >20.0  FERRITIN 44  TIBC 413  IRON 21*  RETICCTPCT 18.8*   Sepsis Labs: No results for input(s): PROCALCITON, LATICACIDVEN in the last 168 hours.  No results found for this or any previous visit (from the past 240 hours).       Radiology Studies: DG Chest Port 1 View Result Date:  09/17/2024 CLINICAL DATA:  Generalized weakness EXAM: PORTABLE CHEST 1 VIEW COMPARISON:  10/22/2023 CT FINDINGS: Cardiac shadow is within normal limits. Aortic calcifications are noted. Lungs are well aerated bilaterally. No focal infiltrate or effusion is seen. IMPRESSION: No active disease. Electronically Signed   By: Oneil Devonshire M.D.   On: 09/17/2024 20:34        Scheduled Meds:  [START ON 09/19/2024] Influenza vac split trivalent PF  0.5 mL Intramuscular Tomorrow-1000   nicotine  14 mg Transdermal Daily   pantoprazole (PROTONIX) IV  40 mg Intravenous BID   Continuous Infusions:  sodium chloride 125 mL/hr at 09/18/24 0816     LOS: 0 days    Time spent: 50 minutes spent on 09/18/2024 caring for this patient face-to-face including chart review, ordering labs/tests, documenting, discussion with nursing staff, consultants, updating family and interview/physical exam    Camellia PARAS [place], DO [facility] Available via Epic secure chat 7am-7pm After these hours, please refer to coverage provider listed on [email or web address] 09/18/2024, 1:04 PM

## 2024-09-18 NOTE — Consult Note (Addendum)
 Referring Provider: TH Primary Care Physician:  Gladystine Erminio CROME, MD Primary Gastroenterologist: [facility]  Reason for Consultation: Melena, symptomatic anemia  HPI: Alice White is a 73 y.o. female with past medical history of coronary artery disease, history of thrombocytosis, history of hearing loss, anxiety, hypertension, hyperlipidemia presented to the hospital with generalized weakness.  Complaining of intermittent black-colored stool for last 2 days.  She was found to have hemoglobin of 6 on admission.  Occult blood positive.  Normal LFTs.  Normal INR.  Mildly elevated lipase at 129.  GI is consulted for further evaluation.  Patient seen and examined at bedside.  Family at bedside.  Patient had episode of nausea vomiting and coffee-ground emesis as well as bright red blood in the vomiting last Monday which was followed by 2 days of black tarry stool with diarrhea.  Denied any associated abdominal pain.  Denied any bright red blood per rectum.  Symptoms resolved now.  Last bowel movement yesterday showed brown-colored stool.  Occasional NSAID use.  Smokes around 15 cigarettes/day.  Past Medical History:  Diagnosis Date   AAA (abdominal aortic aneurysm)    Anxiety    Coronary artery disease involving native coronary artery of native heart without angina pectoris 07/03/2024   Hyperlipidemia    Hypertension    Vertigo     Past Surgical History:  Procedure Laterality Date   CESAREAN SECTION     x 2    Prior to Admission medications   Medication Sig Start Date End Date Taking? Authorizing Provider  ALPRAZolam (XANAX) 0.25 MG tablet Take 0.25 mg by mouth daily as needed. 01/02/20   [provider]  amLODipine (NORVASC) 5 MG tablet amlodipine 5 mg tablet 12/05/19   [provider]  aspirin 81 MG chewable tablet Chew by mouth.    [provider]  fenofibrate 160 MG tablet  03/24/20   [provider]  hydrochlorothiazide (HYDRODIURIL) 25 MG tablet  Take 25 mg by mouth daily. 03/24/20   [provider]  ibandronate (BONIVA) 150 MG tablet Take 150 mg by mouth every 30 (thirty) days. 04/06/22   [provider]  meclizine (ANTIVERT) 50 MG tablet Take by mouth.    [provider]  Multiple Vitamins-Minerals (MULTIVITAMIN WITH MINERALS) tablet Take by mouth.    [provider]  Omega-3 Fatty Acids (FISH OIL) 1000 MG CAPS Take by mouth.    [provider]  rosuvastatin  (CRESTOR ) 20 MG tablet Take 1 tablet (20 mg total) by mouth daily. 08/08/24   Patwardhan, Newman PARAS, MD    Scheduled Meds:  [START ON 09/19/2024] Influenza vac split trivalent PF  0.5 mL Intramuscular Tomorrow-1000   nicotine  14 mg Transdermal Daily   pantoprazole (PROTONIX) IV  40 mg Intravenous BID   Continuous Infusions:  sodium chloride 125 mL/hr at 09/18/24 0816   PRN Meds:.acetaminophen **OR** acetaminophen  Allergies as of 09/17/2024   (No Known Allergies)    Family History  Problem Relation Age of Onset   Heart disease Mother    Hypertension Mother    Heart disease Father     Social History   Socioeconomic History   Marital status: Widowed    Spouse name: Not on file   Number of children: Not on file   Years of education: Not on file   Highest education level: Not on file  Occupational History   Not on file  Tobacco Use   Smoking status: Every Day    Current packs/day: 0.50  Average packs/day: 0.5 packs/day for 54.0 years (27.0 ttl pk-yrs)    Types: Cigarettes    Passive exposure: Never   Smokeless tobacco: Never  Vaping Use   Vaping status: Never Used  Substance and Sexual Activity   Alcohol use: Not Currently   Drug use: Never   Sexual activity: Not Currently    Birth control/protection: Post-menopausal, Surgical  Other Topics Concern   Not on file  Social History Narrative   Not on file   Social Drivers of Health   Financial Resource Strain: Low Risk  (02/04/2024)   Received from [facility]   Overall Financial Resource Strain (CARDIA)    Difficulty of Paying Living Expenses: Not very hard  Food Insecurity: No Food Insecurity (09/18/2024)   Hunger Vital Sign    Worried About Running Out of Food in the Last Year: Never true    Ran Out of Food in the Last Year: Never true  Transportation Needs: No Transportation Needs (09/18/2024)   PRAPARE - Administrator, Civil Service (Medical): No    Lack of Transportation (Non-Medical): No  Physical Activity: Insufficiently Active (10/04/2023)   Received from [facility]   Exercise Vital Sign    On average, how many days per week do you engage in moderate to strenuous exercise (like a brisk walk)?: 3 days    On average, how many minutes do you engage in exercise at this level?: 30 min  Stress: No Stress Concern Present (10/04/2023)   Received from [facility] - Occupational Stress Questionnaire    Feeling of Stress : Only a little  Social Connections: Moderately Isolated (09/18/2024)   Social Connection and Isolation Panel    Frequency of Communication with Friends and Family: Three times a week    Frequency of Social Gatherings with Friends and Family: Three times a week    Attends Religious Services: 1 to 4 times per year    Active Member of Clubs or Organizations: No    Attends Banker Meetings: Never    Marital Status: Widowed  Intimate Partner Violence: Not At Risk (09/18/2024)   Humiliation, Afraid, Rape, and Kick questionnaire    Fear of Current or Ex-Partner: No    Emotionally Abused: No    Physically Abused: No    Sexually Abused: No    Review of Systems: All negative except as stated above in HPI.  Physical Exam: Vital signs: Vitals:   09/18/24 0814 09/18/24 0907  BP: 121/60 113/60  Pulse: 79 75  Resp: 16 20  Temp: 98.4 F (36.9 C) 98.9 F (37.2 C)  SpO2: 98% 96%   Last BM Date : 09/17/24 General:   Alert,  Well-developed,  well-nourished, pleasant and cooperative in NAD Normocephalic, atraumatic Extraocular movement intact Lungs: No visible respiratory distress Heart:  Regular rate and rhythm; no murmurs, clicks, rubs,  or gallops. Abdomen: Soft, nontender, nondistended, bowel sound present, no peritoneal signs Mood and affect normal Alert and oriented x 3 Rectal:  Deferred  GI:  Lab Results: Recent Labs    09/17/24 2006 09/18/24 0235  WBC 7.3 7.7  HGB 6.0* 5.2*  HCT 18.1* 17.2*  PLT 581* 495*   BMET Recent Labs    09/17/24 2006 09/18/24 0235  NA 128* 132*  K 3.2* 4.2  CL 92* 101  CO2 22 20*  GLUCOSE 103* 91  BUN 23 18  CREATININE 1.26* 1.03*  CALCIUM  9.0 8.6*   LFT Recent  Labs    09/17/24 2006  PROT 6.9  ALBUMIN 4.2  AST 22  ALT 13  ALKPHOS 36*  BILITOT 0.3   PT/INR Recent Labs    09/18/24 0235  LABPROT 15.5*  INR 1.2     Studies/Results: DG Chest Port 1 View Result Date: 09/17/2024 CLINICAL DATA:  Generalized weakness EXAM: PORTABLE CHEST 1 VIEW COMPARISON:  10/22/2023 CT FINDINGS: Cardiac shadow is within normal limits. Aortic calcifications are noted. Lungs are well aerated bilaterally. No focal infiltrate or effusion is seen. IMPRESSION: No active disease. Electronically Signed   By: Oneil Devonshire M.D.   On: 09/17/2024 20:34    Impression/Plan: -Hematemesis and melena.  Likely from upper GI bleed.  History of NSAID use.  Smokes around 15 cigarettes/day.  Need to rule out ulcer disease. - Acute blood loss anemia.  Hemoglobin down to 5.2.  Status post 1 unit of blood transfusion and currently getting second unit. - Mild elevated lipase  Recommendations -------------------------- - Plan for EGD tomorrow once hemoglobin is more than 7 - Okay to have soft diet today.  Keep n.p.o. past midnight. - Avoid NSAIDs and smoking. - Repeat labs in the morning - If EGD negative, recommend CT scan for further evaluation of mildly elevated lipase.  Risks (bleeding,  infection, bowel perforation that could require surgery, sedation-related changes in cardiopulmonary systems), benefits (identification and possible treatment of source of symptoms, exclusion of certain causes of symptoms), and alternatives (watchful waiting, radiographic imaging studies, empiric medical treatment)  were explained to patient/family in detail and patient wishes to proceed.     LOS: 0 days   Layla Lah  MD, FACP 09/18/2024, 9:21 AM  Contact #  [phone]

## 2024-09-18 NOTE — H&P (View-Only) (Signed)
 Referring Provider: TH Primary Care Physician:  Gladystine Erminio CROME, MD Primary Gastroenterologist: [place]  Reason for Consultation: Melena, symptomatic anemia  HPI: Alice White is a 73 y.o. female with past medical history of coronary artery disease, history of thrombocytosis, history of hearing loss, anxiety, hypertension, hyperlipidemia presented to the hospital with generalized weakness.  Complaining of intermittent black-colored stool for last 2 days.  She was found to have hemoglobin of 6 on admission.  Occult blood positive.  Normal LFTs.  Normal INR.  Mildly elevated lipase at 129.  GI is consulted for further evaluation.  Patient seen and examined at bedside.  Family at bedside.  Patient had episode of nausea vomiting and coffee-ground emesis as well as bright red blood in the vomiting last Monday which was followed by 2 days of black tarry stool with diarrhea.  Denied any associated abdominal pain.  Denied any bright red blood per rectum.  Symptoms resolved now.  Last bowel movement yesterday showed brown-colored stool.  Occasional NSAID use.  Smokes around 15 cigarettes/day.  Past Medical History:  Diagnosis Date   AAA (abdominal aortic aneurysm)    Anxiety    Coronary artery disease involving native coronary artery of native heart without angina pectoris 07/03/2024   Hyperlipidemia    Hypertension    Vertigo     Past Surgical History:  Procedure Laterality Date   CESAREAN SECTION     x 2    Prior to Admission medications   Medication Sig Start Date End Date Taking? Authorizing Provider  ALPRAZolam (XANAX) 0.25 MG tablet Take 0.25 mg by mouth daily as needed. 01/02/20   [provider]  amLODipine (NORVASC) 5 MG tablet amlodipine 5 mg tablet 12/05/19   [provider]  aspirin 81 MG chewable tablet Chew by mouth.    [provider]  fenofibrate 160 MG tablet  03/24/20   [provider]  hydrochlorothiazide (HYDRODIURIL) 25 MG tablet  Take 25 mg by mouth daily. 03/24/20   [provider]  ibandronate (BONIVA) 150 MG tablet Take 150 mg by mouth every 30 (thirty) days. 04/06/22   [provider]  meclizine (ANTIVERT) 50 MG tablet Take by mouth.    [provider]  Multiple Vitamins-Minerals (MULTIVITAMIN WITH MINERALS) tablet Take by mouth.    [provider]  Omega-3 Fatty Acids (FISH OIL) 1000 MG CAPS Take by mouth.    [provider]  rosuvastatin  (CRESTOR ) 20 MG tablet Take 1 tablet (20 mg total) by mouth daily. 08/08/24   Patwardhan, Newman PARAS, MD    Scheduled Meds:  [START ON 09/19/2024] Influenza vac split trivalent PF  0.5 mL Intramuscular Tomorrow-1000   nicotine  14 mg Transdermal Daily   pantoprazole (PROTONIX) IV  40 mg Intravenous BID   Continuous Infusions:  sodium chloride 125 mL/hr at 09/18/24 0816   PRN Meds:.acetaminophen **OR** acetaminophen  Allergies as of 09/17/2024   (No Known Allergies)    Family History  Problem Relation Age of Onset   Heart disease Mother    Hypertension Mother    Heart disease Father     Social History   Socioeconomic History   Marital status: Widowed    Spouse name: Not on file   Number of children: Not on file   Years of education: Not on file   Highest education level: Not on file  Occupational History   Not on file  Tobacco Use   Smoking status: Every Day    Current packs/day: 0.50  Average packs/day: 0.5 packs/day for 54.0 years (27.0 ttl pk-yrs)    Types: Cigarettes    Passive exposure: Never   Smokeless tobacco: Never  Vaping Use   Vaping status: Never Used  Substance and Sexual Activity   Alcohol use: Not Currently   Drug use: Never   Sexual activity: Not Currently    Birth control/protection: Post-menopausal, Surgical  Other Topics Concern   Not on file  Social History Narrative   Not on file   Social Drivers of Health   Financial Resource Strain: Low Risk  (02/04/2024)   Received from [facility]   Overall Financial Resource Strain (CARDIA)    Difficulty of Paying Living Expenses: Not very hard  Food Insecurity: No Food Insecurity (09/18/2024)   Hunger Vital Sign    Worried About Running Out of Food in the Last Year: Never true    Ran Out of Food in the Last Year: Never true  Transportation Needs: No Transportation Needs (09/18/2024)   PRAPARE - Administrator, Civil Service (Medical): No    Lack of Transportation (Non-Medical): No  Physical Activity: Insufficiently Active (10/04/2023)   Received from [facility]   Exercise Vital Sign    On average, how many days per week do you engage in moderate to strenuous exercise (like a brisk walk)?: 3 days    On average, how many minutes do you engage in exercise at this level?: 30 min  Stress: No Stress Concern Present (10/04/2023)   Received from [facility] - Occupational Stress Questionnaire    Feeling of Stress : Only a little  Social Connections: Moderately Isolated (09/18/2024)   Social Connection and Isolation Panel    Frequency of Communication with Friends and Family: Three times a week    Frequency of Social Gatherings with Friends and Family: Three times a week    Attends Religious Services: 1 to 4 times per year    Active Member of Clubs or Organizations: No    Attends Banker Meetings: Never    Marital Status: Widowed  Intimate Partner Violence: Not At Risk (09/18/2024)   Humiliation, Afraid, Rape, and Kick questionnaire    Fear of Current or Ex-Partner: No    Emotionally Abused: No    Physically Abused: No    Sexually Abused: No    Review of Systems: All negative except as stated above in HPI.  Physical Exam: Vital signs: Vitals:   09/18/24 0814 09/18/24 0907  BP: 121/60 113/60  Pulse: 79 75  Resp: 16 20  Temp: 98.4 F (36.9 C) 98.9 F (37.2 C)  SpO2: 98% 96%   Last BM Date : 09/17/24 General:   Alert,  Well-developed,  well-nourished, pleasant and cooperative in NAD Normocephalic, atraumatic Extraocular movement intact Lungs: No visible respiratory distress Heart:  Regular rate and rhythm; no murmurs, clicks, rubs,  or gallops. Abdomen: Soft, nontender, nondistended, bowel sound present, no peritoneal signs Mood and affect normal Alert and oriented x 3 Rectal:  Deferred  GI:  Lab Results: Recent Labs    09/17/24 2006 09/18/24 0235  WBC 7.3 7.7  HGB 6.0* 5.2*  HCT 18.1* 17.2*  PLT 581* 495*   BMET Recent Labs    09/17/24 2006 09/18/24 0235  NA 128* 132*  K 3.2* 4.2  CL 92* 101  CO2 22 20*  GLUCOSE 103* 91  BUN 23 18  CREATININE 1.26* 1.03*  CALCIUM  9.0 8.6*   LFT Recent  Labs    09/17/24 2006  PROT 6.9  ALBUMIN 4.2  AST 22  ALT 13  ALKPHOS 36*  BILITOT 0.3   PT/INR Recent Labs    09/18/24 0235  LABPROT 15.5*  INR 1.2     Studies/Results: DG Chest Port 1 View Result Date: 09/17/2024 CLINICAL DATA:  Generalized weakness EXAM: PORTABLE CHEST 1 VIEW COMPARISON:  10/22/2023 CT FINDINGS: Cardiac shadow is within normal limits. Aortic calcifications are noted. Lungs are well aerated bilaterally. No focal infiltrate or effusion is seen. IMPRESSION: No active disease. Electronically Signed   By: Oneil Devonshire M.D.   On: 09/17/2024 20:34    Impression/Plan: -Hematemesis and melena.  Likely from upper GI bleed.  History of NSAID use.  Smokes around 15 cigarettes/day.  Need to rule out ulcer disease. - Acute blood loss anemia.  Hemoglobin down to 5.2.  Status post 1 unit of blood transfusion and currently getting second unit. - Mild elevated lipase  Recommendations -------------------------- - Plan for EGD tomorrow once hemoglobin is more than 7 - Okay to have soft diet today.  Keep n.p.o. past midnight. - Avoid NSAIDs and smoking. - Repeat labs in the morning - If EGD negative, recommend CT scan for further evaluation of mildly elevated lipase.  Risks (bleeding,  infection, bowel perforation that could require surgery, sedation-related changes in cardiopulmonary systems), benefits (identification and possible treatment of source of symptoms, exclusion of certain causes of symptoms), and alternatives (watchful waiting, radiographic imaging studies, empiric medical treatment)  were explained to patient/family in detail and patient wishes to proceed.     LOS: 0 days   Layla Lah  MD, FACP 09/18/2024, 9:21 AM  Contact #  [phone]

## 2024-09-18 NOTE — H&P (Signed)
 History and Physical    Alice White FMW:[id number] DOB: 07-01-1951 DOA: 09/17/2024  PCP: Gladystine Erminio CROME, MD  Patient coming from: [facility] ED  Chief Complaint: Generalized weakness  HPI: Alice White is a 73 y.o. female with medical history significant of CAD, AAA, tobacco abuse, thrombocytosis, hypertension, hyperlipidemia, bilateral hearing loss, anxiety, vertigo presenting with a chief complaint of generalized weakness.  Patient reports history of dizziness, nausea, and vomiting a week ago which she attributed to vertigo.  Also at that time she had black-colored stools/diarrhea for about 2 days.  No hematemesis.  Nausea, vomiting, and diarrhea resolved but her appetite remained poor.  She did not have another bowel movement until yesterday which was no longer black in color but still dark brown.  She denies having any abdominal pain since the onset of her symptoms a week ago.  Reporting generalized weakness/fatigue.  Denies history of pancreatitis or prior GI bleed.  She never had an EGD done in the past.  Her last colonoscopy was in February 2023 at [facility] and she was told that she will need another one done in 5 years due to having polyps.  Denies alcohol use.  She takes aspirin 81 mg daily but not on anticoagulation.  Denies regular NSAID use.  She smokes 10 to 15 cigarettes a day.  ED Course: Hemodynamically stable.  Labs notable for hemoglobin 6.0, MCV 95.3, platelet count 581k, sodium 128, potassium 3.2, chloride 92, creatinine 1.26, no elevation of LFTs, lipase 129, troponin 21> 19, TSH and free T4 pending, magnesium 2.4, CK normal, anemia panel pending, FOBT positive, UA not suggestive of infection.  Chest x-ray showing no active disease.  Patient was given IV Protonix 80 mg, oral potassium 40 mEq, and 1 L normal saline bolus.  ED physician has sent a message to [facility] Dr. Burnette requesting consultation in the morning.  Review of Systems:  Review of Systems  All other systems reviewed  and are negative.   Past Medical History:  Diagnosis Date   AAA (abdominal aortic aneurysm)    Anxiety    Coronary artery disease involving native coronary artery of native heart without angina pectoris 07/03/2024   Hyperlipidemia    Hypertension    Vertigo     Past Surgical History:  Procedure Laterality Date   CESAREAN SECTION     x 2     reports that she has been smoking cigarettes. She has a 27 pack-year smoking history. She has never been exposed to tobacco smoke. She has never used smokeless tobacco. She reports that she does not currently use alcohol. She reports that she does not use drugs.  No Known Allergies  Family History  Problem Relation Age of Onset   Heart disease Mother    Hypertension Mother    Heart disease Father     Prior to Admission medications   Medication Sig Start Date End Date Taking? Authorizing Provider  ALPRAZolam (XANAX) 0.25 MG tablet Take 0.25 mg by mouth daily as needed. 01/02/20   [provider]  amLODipine (NORVASC) 5 MG tablet amlodipine 5 mg tablet 12/05/19   [provider]  aspirin 81 MG chewable tablet Chew by mouth.    [provider]  fenofibrate 160 MG tablet  03/24/20   [provider]  hydrochlorothiazide (HYDRODIURIL) 25 MG tablet Take 25 mg by mouth daily. 03/24/20   [provider]  ibandronate (BONIVA) 150 MG tablet Take 150 mg by mouth every 30 (thirty) days. 04/06/22  [provider]  meclizine (ANTIVERT) 50 MG tablet Take by mouth.    [provider]  Multiple Vitamins-Minerals (MULTIVITAMIN WITH MINERALS) tablet Take by mouth.    [provider]  Omega-3 Fatty Acids (FISH OIL) 1000 MG CAPS Take by mouth.    [provider]  rosuvastatin  (CRESTOR ) 20 MG tablet Take 1 tablet (20 mg total) by mouth daily. 08/08/24   [place] Newman PARAS, MD    Physical Exam: Vitals:   09/17/24 2100 09/17/24 2130 09/17/24 2300 09/18/24 0045  BP: 108/66 (!) 114/57  (!) 111/58 (!) 119/57  Pulse: 75 78 82 90  Resp: (!) 22 14 19    Temp:    99.3 F (37.4 C)  TempSrc:    Oral  SpO2: 97% 96% 96% 100%  Weight:      Height:        Physical Exam Vitals reviewed.  Constitutional:      General: She is not in acute distress. HENT:     Head: Normocephalic and atraumatic.  Eyes:     Extraocular Movements: Extraocular movements intact.  Cardiovascular:     Rate and Rhythm: Normal rate and regular rhythm.     Heart sounds: Normal heart sounds.  Pulmonary:     Effort: Pulmonary effort is normal. No respiratory distress.     Breath sounds: Normal breath sounds.  Abdominal:     General: Bowel sounds are normal. There is no distension.     Palpations: Abdomen is soft.     Tenderness: There is no abdominal tenderness. There is no guarding.  Musculoskeletal:     Cervical back: Normal range of motion.     Right lower leg: No edema.     Left lower leg: No edema.  Skin:    General: Skin is warm and dry.     Coloration: Skin is pale.  Neurological:     General: No focal deficit present.     Mental Status: She is alert and oriented to person, place, and time.     Labs on Admission: I have personally reviewed following labs and imaging studies  CBC: Recent Labs  Lab 09/17/24 2006  WBC 7.3  NEUTROABS 5.2  HGB 6.0*  HCT 18.1*  MCV 95.3  PLT 581*   Basic Metabolic Panel: Recent Labs  Lab 09/17/24 2006  NA 128*  K 3.2*  CL 92*  CO2 22  GLUCOSE 103*  BUN 23  CREATININE 1.26*  CALCIUM  9.0  MG 2.4   GFR: Estimated Creatinine Clearance: 31.9 mL/min (A) (by C-G formula based on SCr of 1.26 mg/dL (H)). Liver Function Tests: Recent Labs  Lab 09/17/24 2006  AST 22  ALT 13  ALKPHOS 36*  BILITOT 0.3  PROT 6.9  ALBUMIN 4.2   Recent Labs  Lab 09/17/24 2006  LIPASE 129*   No results for input(s): AMMONIA in the last 168 hours. Coagulation Profile: No results for input(s): INR, PROTIME in the last 168 hours. Cardiac  Enzymes: Recent Labs  Lab 09/17/24 2006  CKTOTAL 69   BNP (last 3 results) No results for input(s): PROBNP in the last 8760 hours. HbA1C: No results for input(s): HGBA1C in the last 72 hours. CBG: No results for input(s): GLUCAP in the last 168 hours. Lipid Profile: No results for input(s): CHOL, HDL, LDLCALC, TRIG, CHOLHDL, LDLDIRECT in the last 72 hours. Thyroid Function Tests: No results for input(s): TSH, T4TOTAL, FREET4, T3FREE, THYROIDAB in the last 72 hours. Anemia Panel: Recent Labs    09/17/24  2041  RETICCTPCT 18.8*   Urine analysis:    Component Value Date/Time   COLORURINE YELLOW 09/17/2024 2250   APPEARANCEUR CLEAR 09/17/2024 2250   LABSPEC 1.010 09/17/2024 2250   PHURINE 7.0 09/17/2024 2250   GLUCOSEU NEGATIVE 09/17/2024 2250   HGBUR NEGATIVE 09/17/2024 2250   BILIRUBINUR NEGATIVE 09/17/2024 2250   KETONESUR NEGATIVE 09/17/2024 2250   PROTEINUR NEGATIVE 09/17/2024 2250   NITRITE NEGATIVE 09/17/2024 2250   LEUKOCYTESUR NEGATIVE 09/17/2024 2250    Radiological Exams on Admission: DG Chest Port 1 View Result Date: 09/17/2024 CLINICAL DATA:  Generalized weakness EXAM: PORTABLE CHEST 1 VIEW COMPARISON:  10/22/2023 CT FINDINGS: Cardiac shadow is within normal limits. Aortic calcifications are noted. Lungs are well aerated bilaterally. No focal infiltrate or effusion is seen. IMPRESSION: No active disease. Electronically Signed   By: Oneil Devonshire M.D.   On: 09/17/2024 20:34    Assessment and Plan  Acute upper GI bleed/melena Symptomatic acute blood loss anemia Hemodynamically stable.  No hematemesis.  Hemoglobin 6.0 (previously 13.4 in February 2025).  FOBT positive.  She takes aspirin 81 mg daily but not on anticoagulation.  Denies alcohol or regular NSAID use.  Patient will need EGD for further evaluation and [facility] has been consulted.  Keep n.p.o., IV fluid hydration, and continue IV Protonix 40 mg every 12 hours.  Type and  screen, 2 units PRBCs ordered.  Continue to transfuse if hemoglobin is less than 8 given history of CAD.  Anemia panel pending.  Hold aspirin.  AKI Likely prerenal in etiology in the setting of acute GI bleed.  Creatinine 1.26, previously 0.79 in September 2024.  Continue IV fluid hydration and monitor renal function.  Avoid nephrotoxic agents/hold hydrochlorothiazide.  Elevated lipase ?Acute pancreatitis but vomiting has resolved and no abdominal pain or tenderness.  Continue IV fluid hydration and trend lipase.  Hyponatremia Likely due to poor p.o. intake in the setting of recent vomiting.  Continue IV fluid hydration with normal saline and monitor labs.  Check serum osmolarity.  Hypokalemia No evidence of hypomagnesemia on labs.  Continue to monitor labs and replace potassium as needed.  Chronic thrombocytosis Seen by outpatient hematology and evaluation in the past came back negative for JAK2, MPL, and CALR.  Thrombocytosis was felt to be reactive and likely related to tobacco abuse.  Platelet count appears to be higher than baseline.  Continue to monitor CBC and encourage smoking cessation.  Patient will need outpatient hematology follow-up.  CAD Troponin minimally elevated and stable, not consistent with ACS.  Patient is not endorsing chest pain.  Cigarette smoking NicoDerm patch and counseled to quit.  Hypertension Holding antihypertensives at this time given concern for acute GI bleed/risk of hypotension.  Hyperlipidemia N.p.o. at this time and holding p.o. meds.  DVT prophylaxis: SCDs Code Status: Full Code (discussed with the patient) Level of care: Progressive Care Unit Admission status: It is my clinical opinion that admission to INPATIENT is reasonable and necessary because of the expectation that this patient will require hospital care that crosses at least 2 midnights to treat this condition based on the medical complexity of the problems presented.  Given the  aforementioned information, the predictability of an adverse outcome is felt to be significant.  Editha Ram MD [facility]  If 7PM-7AM, please contact night-coverage [email or web address]  09/18/2024, 1:38 AM

## 2024-09-19 ENCOUNTER — Inpatient Hospital Stay (HOSPITAL_COMMUNITY): Admitting: Anesthesiology

## 2024-09-19 ENCOUNTER — Inpatient Hospital Stay (HOSPITAL_COMMUNITY)

## 2024-09-19 ENCOUNTER — Encounter (HOSPITAL_COMMUNITY)
Admission: EM | Disposition: A | Discharge status: Home / Self Care | Payer: Self-pay | Source: Home / Self Care | Attending: Internal Medicine

## 2024-09-19 ENCOUNTER — Encounter (HOSPITAL_COMMUNITY): Payer: Self-pay | Admitting: Internal Medicine

## 2024-09-19 DIAGNOSIS — K922 Gastrointestinal hemorrhage, unspecified: Secondary | ICD-10-CM | POA: Diagnosis not present

## 2024-09-19 DIAGNOSIS — F17210 Nicotine dependence, cigarettes, uncomplicated: Secondary | ICD-10-CM

## 2024-09-19 DIAGNOSIS — K2970 Gastritis, unspecified, without bleeding: Secondary | ICD-10-CM

## 2024-09-19 DIAGNOSIS — I10 Essential (primary) hypertension: Secondary | ICD-10-CM

## 2024-09-19 DIAGNOSIS — I2510 Atherosclerotic heart disease of native coronary artery without angina pectoris: Secondary | ICD-10-CM

## 2024-09-19 HISTORY — PX: ESOPHAGOGASTRODUODENOSCOPY: SHX5428

## 2024-09-19 LAB — BPAM RBC
Blood Product Expiration Date: 202511042359
Blood Product Expiration Date: 202511042359
ISSUE DATE / TIME: 202510130504
ISSUE DATE / TIME: 202510130916
Unit Type and Rh: 7300
Unit Type and Rh: 7300

## 2024-09-19 LAB — CBC
HCT: 26.9 % — ABNORMAL LOW (ref 36.0–46.0)
Hemoglobin: 8.6 g/dL — ABNORMAL LOW (ref 12.0–15.0)
MCH: 29.4 pg (ref 26.0–34.0)
MCHC: 32 g/dL (ref 30.0–36.0)
MCV: 91.8 fL (ref 80.0–100.0)
Platelets: 466 K/uL — ABNORMAL HIGH (ref 150–400)
RBC: 2.93 MIL/uL — ABNORMAL LOW (ref 3.87–5.11)
RDW: 18.5 % — ABNORMAL HIGH (ref 11.5–15.5)
WBC: 8.3 K/uL (ref 4.0–10.5)
nRBC: 0 % (ref 0.0–0.2)

## 2024-09-19 LAB — COMPREHENSIVE METABOLIC PANEL WITH GFR
ALT: 6 U/L (ref 0–44)
AST: 22 U/L (ref 15–41)
Albumin: 3.6 g/dL (ref 3.5–5.0)
Alkaline Phosphatase: 37 U/L — ABNORMAL LOW (ref 38–126)
Anion gap: 12 (ref 5–15)
BUN: 14 mg/dL (ref 8–23)
CO2: 18 mmol/L — ABNORMAL LOW (ref 22–32)
Calcium: 8.3 mg/dL — ABNORMAL LOW (ref 8.9–10.3)
Chloride: 101 mmol/L (ref 98–111)
Creatinine, Ser: 0.93 mg/dL (ref 0.44–1.00)
GFR, Estimated: >60 mL/min (ref >60)
Glucose, Bld: 122 mg/dL — ABNORMAL HIGH (ref 70–99)
Potassium: 3.4 mmol/L — ABNORMAL LOW (ref 3.5–5.1)
Sodium: 131 mmol/L — ABNORMAL LOW (ref 135–145)
Total Bilirubin: 0.6 mg/dL (ref 0.0–1.2)
Total Protein: 5.9 g/dL — ABNORMAL LOW (ref 6.5–8.1)

## 2024-09-19 LAB — TYPE AND SCREEN
ABO/RH(D): B POS
Antibody Screen: NEGATIVE
Unit division: 0
Unit division: 0

## 2024-09-19 LAB — HEMOGLOBIN AND HEMATOCRIT, BLOOD
HCT: 27.7 % — ABNORMAL LOW (ref 36.0–46.0)
HCT: 26.8 % — ABNORMAL LOW (ref 36.0–46.0)
HCT: 27.4 % — ABNORMAL LOW (ref 36.0–46.0)
Hemoglobin: 8.7 g/dL — ABNORMAL LOW (ref 12.0–15.0)
Hemoglobin: 8.6 g/dL — ABNORMAL LOW (ref 12.0–15.0)
Hemoglobin: 8.7 g/dL — ABNORMAL LOW (ref 12.0–15.0)

## 2024-09-19 LAB — LIPASE, BLOOD: Lipase: 197 U/L — ABNORMAL HIGH (ref 11–51)

## 2024-09-19 LAB — MAGNESIUM: Magnesium: 1.8 mg/dL (ref 1.7–2.4)

## 2024-09-19 MED ORDER — IOHEXOL 300 MG/ML  SOLN
100.0000 mL | Freq: Once | INTRAMUSCULAR | Status: AC | PRN
  Administered 2024-09-19: 100 mL via INTRAVENOUS

## 2024-09-19 MED ORDER — POTASSIUM CHLORIDE 10 MEQ/100ML IV SOLN
10.0000 meq | INTRAVENOUS | Status: AC
  Administered 2024-09-19 (×2): 10 meq via INTRAVENOUS
  Filled 2024-09-19 (×2): qty 100

## 2024-09-19 MED ORDER — POTASSIUM CHLORIDE IN NACL 40-0.9 MEQ/L-% IV SOLN
INTRAVENOUS | Status: AC
  Filled 2024-09-19 (×2): qty 1000

## 2024-09-19 MED ORDER — PROPOFOL 500 MG/50ML IV EMUL
INTRAVENOUS | Status: AC
  Filled 2024-09-19: qty 50

## 2024-09-19 MED ORDER — PROPOFOL 10 MG/ML IV BOLUS
INTRAVENOUS | Status: DC | PRN
Start: 2024-09-19 — End: 2024-09-19
  Administered 2024-09-19 (×2): 20 mg via INTRAVENOUS

## 2024-09-19 MED ORDER — LIDOCAINE 2% (20 MG/ML) 5 ML SYRINGE
INTRAMUSCULAR | Status: DC | PRN
  Administered 2024-09-19: 40 mg via INTRAVENOUS

## 2024-09-19 MED ORDER — PROPOFOL 500 MG/50ML IV EMUL
INTRAVENOUS | Status: DC | PRN
  Administered 2024-09-19: 180 ug/kg/min via INTRAVENOUS

## 2024-09-19 MED ORDER — IOHEXOL 9 MG/ML PO SOLN
ORAL | Status: AC
  Filled 2024-09-19: qty 500

## 2024-09-19 MED ORDER — IOHEXOL 9 MG/ML PO SOLN
500.0000 mL | Freq: Once | ORAL | Status: AC
  Administered 2024-09-19: 500 mL via ORAL

## 2024-09-19 NOTE — Progress Notes (Signed)
 PROGRESS NOTE    Alice White  FMW:[id number] DOB: [birth year] DOA: 09/17/2024 PCP: Gladystine Erminio CROME, MD    Brief Narrative:   Alice White is a 73 y.o. female with past medical history significant for HTN, HLD, CAD, AAA, thrombocytosis, anxiety, bilateral hearing loss, vertigo, tobacco use disorder who presented to [facility] ED on 09/17/2024 with complaints of generalized weakness.  Patient reports history of dizziness, nausea, and vomiting a week ago which she attributed to vertigo.  Also at that time she had black-colored stools/diarrhea for about 2 days.  No hematemesis.  Nausea, vomiting, and diarrhea resolved but her appetite remained poor.  She did not have another bowel movement until yesterday which was no longer black in color but still dark brown.  She denies having any abdominal pain since the onset of her symptoms a week ago.  Reporting generalized weakness/fatigue.  Denies history of pancreatitis or prior GI bleed.  She never had an EGD done in the past.  Her last colonoscopy was in February 2023 at [facility] and she was told that she will need another one done in 5 years due to having polyps.  Denies alcohol use.  She takes aspirin 81 mg daily but not on anticoagulation.  Denies regular NSAID use.  She smokes 10 to 15 cigarettes a day.   In the ED, temperature 98.0 F, HR 91, RR 14, BP 107/77, SpO2 100% on room air.  Hemoglobin 6.0, MCV 95.3, platelet count 581k, sodium 128, potassium 3.2, chloride 92, creatinine 1.26, no elevation of LFTs, lipase 129, troponin 21> 19, TSH and free T4 pending, magnesium 2.4, CK normal, anemia panel pending, FOBT positive, UA not suggestive of infection.  Chest x-ray showing no active disease.  Patient was given IV Protonix 80 mg, oral potassium 40 mEq, and 1 L normal saline bolus.  [facility] gastroenterology consulted.  [facility] consulted for admission.    Assessment & Plan:   Acute upper GI bleed Symptomatic acute blood loss anemia Patient  presenting with generalized weakness, dark tarry stools over the last 2 weeks.  On aspirin at baseline.  No other anticoagulant/antiplatelet use, denies NSAID abuse.  Hemoglobin 6.0 (previously 13.4 in February 2025).  FOBT positive.  Anemia panel with iron 21, TIBC 413, ferritin 44, folate greater than 20.0, vitamin B12 1397. -- [facility] following, appreciate assistance -- Hgb 6.0>5.2>9.3>8.6>8.7 -- s/p 2u pRBC on 10/13 -- Protonix 40 mg IV every 12 hours -- H&H every 6 hours -- Transfuse for hemoglobin less than 7.0 -- Holding aspirin -- N.p.o. for planned EGD today  Acute renal failure: Resolved Creatinine 1.26 on admission.  Likely prerenal in etiology in the setting of acute GI bleed.  Previously 0.79 in September 2024.   -- Cr 1.26>1.03>0.93 -- Continue IVF -- Holding home hydrochlorothiazide -- BMP in a.m.   Elevated lipase Question acute pancreatitis but vomiting has resolved and no abdominal pain or tenderness.   -- Continue IV fluid hydration and trend lipase. -- If lipase remains elevated, consider CT imaging abdomen   Hyponatremia Likely due to poor p.o. intake in the setting of recent vomiting.   -- Na 128>132>131 -- Holding home HCTZ -- IVF  -- BMP am   Hypokalemia Potassium 3.4, will replete. --Repeat electrolytes in a.m.   Chronic thrombocytosis Seen by outpatient hematology and evaluation in the past came back negative for JAK2, MPL, and CALR.  Thrombocytosis was felt to be reactive and likely related to tobacco abuse.  Platelet count appears to be  higher than baseline.   -- Continue to monitor CBC and encourage smoking cessation.    CAD Troponin minimally elevated and stable, not consistent with ACS.  Patient is not endorsing chest pain. -- Monitor on telemetry   Tobacco use disorder Counseled on need for complete cessation/abstinence. -- Nicotine patch   Hypertension At baseline on amlodipine 5 mg p.o. daily, hydrochlorothiazide 25 mg p.o. daily -- BP  122/67 this morning -- Holding antihypertensives at this time given concern for acute GI bleed/risk of hypotension.   Hyperlipidemia -- Holding home Crestor , fenofibrate   DVT prophylaxis: SCDs Start: 09/18/24 0230    Code Status: Full Code Family Communication: No family present at bedside this morning  Disposition Plan:  Level of care: Progressive Status is: Inpatient Remains inpatient appropriate because: Blood transfusion, pending EGD    Consultants:  [facility]  Procedures:  None  Antimicrobials:  None   Subjective: Patient seen examined bedside, lying in bed.  Hemoglobin stable, 8.7 this morning.  No complaints.  Reports that her bowel movements are lighter in color.  Awaiting EGD this morning.  No other concerns or questions at this time.  Denies headache, no dizziness, no fever/chills/night sweats, no nausea/vomiting/diarrhea, no chest pain, no palpitations, no shortness of breath, no abdominal pain, no focal weakness, no fatigue, no paresthesias.  No acute events overnight per nursing staff.  Objective: Vitals:   09/18/24 0907 09/18/24 0938 09/18/24 1324 09/19/24 0616  BP: 113/60 120/77 121/61 122/67  Pulse: 75 80 86 73  Resp: 20 (!) 22 20 18   Temp: 98.9 F (37.2 C) 98.4 F (36.9 C) 98.6 F (37 C) 98.9 F (37.2 C)  TempSrc: Oral  Oral Oral  SpO2: 96%  96% 98%  Weight:      Height:        Intake/Output Summary (Last 24 hours) at 09/19/2024 1037 Last data filed at 09/19/2024 0000 Gross per 24 hour  Intake 1400.89 ml  Output --  Net 1400.89 ml   Filed Weights   09/17/24 1915  Weight: 53.5 kg    Examination:  Physical Exam: GEN: NAD, alert and oriented x 3, wd/wn HEENT: NCAT, PERRL, EOMI, sclera clear, MMM PULM: CTAB w/o wheezes/crackles, normal respiratory effort, on room air CV: RRR w/o M/G/R GI: abd soft, NTND, + BS MSK: no peripheral edema, moves all extremities independently NEURO: CN II-XII intact, no focal deficits,  sensation to light touch intact PSYCH: normal mood/affect Integumentary: dry/intact, no rashes or wounds    Data Reviewed: I have personally reviewed following labs and imaging studies  CBC: Recent Labs  Lab 09/17/24 2006 09/18/24 0235 09/18/24 1530 09/19/24 0024 09/19/24 0714  WBC 7.3 7.7  --  8.3  --   NEUTROABS 5.2  --   --   --   --   HGB 6.0* 5.2* 9.3* 8.6* 8.7*  HCT 18.1* 17.2* 29.8* 26.9* 27.7*  MCV 95.3 99.4  --  91.8  --   PLT 581* 495*  --  466*  --    Basic Metabolic Panel: Recent Labs  Lab 09/17/24 2006 09/18/24 0235 09/19/24 0024  NA 128* 132* 131*  K 3.2* 4.2 3.4*  CL 92* 101 101  CO2 22 20* 18*  GLUCOSE 103* 91 122*  BUN 23 18 14   CREATININE 1.26* 1.03* 0.93  CALCIUM  9.0 8.6* 8.3*  MG 2.4  --  1.8  PHOS  --  2.5  --    GFR: Estimated Creatinine Clearance: 43.2 mL/min (by C-G formula based on  SCr of 0.93 mg/dL). Liver Function Tests: Recent Labs  Lab 09/17/24 2006 09/19/24 0024  AST 22 22  ALT 13 6  ALKPHOS 36* 37*  BILITOT 0.3 0.6  PROT 6.9 5.9*  ALBUMIN 4.2 3.6   Recent Labs  Lab 09/17/24 2006 09/18/24 0235 09/19/24 0024  LIPASE 129* 134* 197*   No results for input(s): AMMONIA in the last 168 hours. Coagulation Profile: Recent Labs  Lab 09/18/24 0235  INR 1.2   Cardiac Enzymes: Recent Labs  Lab 09/17/24 2006  CKTOTAL 69   BNP (last 3 results) No results for input(s): PROBNP in the last 8760 hours. HbA1C: No results for input(s): HGBA1C in the last 72 hours. CBG: No results for input(s): GLUCAP in the last 168 hours. Lipid Profile: No results for input(s): CHOL, HDL, LDLCALC, TRIG, CHOLHDL, LDLDIRECT in the last 72 hours. Thyroid Function Tests: Recent Labs    09/17/24 2006  TSH 2.327  FREET4 1.04   Anemia Panel: Recent Labs    09/17/24 2041  VITAMINB12 1,397*  FOLATE >20.0  FERRITIN 44  TIBC 413  IRON 21*  RETICCTPCT 18.8*   Sepsis Labs: No results for input(s): PROCALCITON,  LATICACIDVEN in the last 168 hours.  No results found for this or any previous visit (from the past 240 hours).       Radiology Studies: DG Chest Port 1 View Result Date: 09/17/2024 CLINICAL DATA:  Generalized weakness EXAM: PORTABLE CHEST 1 VIEW COMPARISON:  10/22/2023 CT FINDINGS: Cardiac shadow is within normal limits. Aortic calcifications are noted. Lungs are well aerated bilaterally. No focal infiltrate or effusion is seen. IMPRESSION: No active disease. Electronically Signed   By: Oneil Devonshire M.D.   On: 09/17/2024 20:34        Scheduled Meds:  feeding supplement  237 mL Oral BID BM   nicotine  14 mg Transdermal Daily   pantoprazole (PROTONIX) IV  40 mg Intravenous BID   Continuous Infusions:  sodium chloride Stopped (09/19/24 0914)   0.9 % NaCl with KCl 40 mEq / L 75 mL/hr at 09/19/24 0912     LOS: 1 day    Time spent: 48 minutes spent on 09/19/2024 caring for this patient face-to-face including chart review, ordering labs/tests, documenting, discussion with nursing staff, consultants, updating family and interview/physical exam    Camellia PARAS [place], DO [facility] Available via Epic secure chat 7am-7pm After these hours, please refer to coverage provider listed on [email or web address] 09/19/2024, 10:37 AM

## 2024-09-19 NOTE — Interval H&P Note (Signed)
 History and Physical Interval Note:  09/19/2024 10:49 AM  Alice White  has presented today for surgery, with the diagnosis of Upper GI bleed.  The various methods of treatment have been discussed with the patient and family. After consideration of risks, benefits and other options for treatment, the patient has consented to  Procedure(s): EGD (ESOPHAGOGASTRODUODENOSCOPY) (N/A) as a surgical intervention.  The patient's history has been reviewed, patient examined, no change in status, stable for surgery.  I have reviewed the patient's chart and labs.  Questions were answered to the patient's satisfaction.     Leonides Minder

## 2024-09-19 NOTE — Transfer of Care (Signed)
 Immediate Anesthesia Transfer of Care Note  Patient: Alice White  Procedure(s) Performed: EGD (ESOPHAGOGASTRODUODENOSCOPY)  Patient Location: Endoscopy Unit  Anesthesia Type:MAC  Level of Consciousness: drowsy  Airway & Oxygen Therapy: Patient Spontanous Breathing and Patient connected to face mask oxygen  Post-op Assessment: Report given to RN and Post -op Vital signs reviewed and stable  Post vital signs: Reviewed and stable  Last Vitals:  Vitals Value Taken Time  BP 106/56 09/19/24 11:20  Temp    Pulse 75 09/19/24 11:21  Resp 25 09/19/24 11:21  SpO2 100 % 09/19/24 11:21  Vitals shown include unfiled device data.  Last Pain:  Vitals:   09/19/24 1048  TempSrc: Temporal  PainSc: 0-No pain      Patients Stated Pain Goal: 0 (09/19/24 1048)  Complications: No notable events documented.

## 2024-09-19 NOTE — Op Note (Signed)
 [facility] Patient Name: Alice White Procedure Date: 09/19/2024 MRN: [id number] Attending MD: Layla Lah , MD, [id number] Date of Birth: [birth year] CSN: [id number] Age: 73 Admit Type: Inpatient Procedure:                Upper GI endoscopy Indications:              Melena Providers:                Layla Lah, MD, Jacquelyn Jaci Pierce, RN,                            Haskel Chris, Technician Referring MD:              Medicines:                Sedation Administered by an Anesthesia Professional Complications:            No immediate complications. Estimated Blood Loss:     Estimated blood loss was minimal. Procedure:                Pre-Anesthesia Assessment:                           - Prior to the procedure, a History and Physical                            was performed, and patient medications and                            allergies were reviewed. The patient's tolerance of                            previous anesthesia was also reviewed. The risks                            and benefits of the procedure and the sedation                            options and risks were discussed with the patient.                            All questions were answered, and informed consent                            was obtained. Prior Anticoagulants: The patient has                            taken no anticoagulant or antiplatelet agents. ASA                            Grade Assessment: III - A patient with severe                            systemic disease. After reviewing the risks and  benefits, the patient was deemed in satisfactory                            condition to undergo the procedure.                           After obtaining informed consent, the endoscope was                            passed under direct vision. Throughout the                            procedure, the patient's blood pressure, pulse, and                             oxygen saturations were monitored continuously. The                            [id number]-H190 ([id number]) Olympus endoscope was introduced                            through the mouth, and advanced to the second part                            of duodenum. The upper GI endoscopy was                            accomplished without difficulty. The patient                            tolerated the procedure well. Scope In: Scope Out: Findings:      Patchy, white plaques were found in the lower third of the esophagus.       Biopsies were taken with a cold forceps for histology.      A low-grade of narrowing Schatzki ring was found at the gastroesophageal       junction.      LA Grade A (one or more mucosal breaks less than 5 mm, not extending       between tops of 2 mucosal folds) esophagitis was found at the       gastroesophageal junction.      A small hiatal hernia was present.      Scattered mild inflammation characterized by congestion (edema) and       erythema was found in the prepyloric region of the stomach. Biopsies       were taken with a cold forceps for histology.      The cardia and gastric fundus were normal on retroflexion.      Two non-bleeding superficial duodenal ulcers with no stigmata of       bleeding were found in the duodenal bulb and in the first portion of the       duodenum. The largest lesion was 6 mm in largest dimension.      Patchy severe inflammation characterized by congestion (edema), erythema       and friability was found in the duodenal bulb and in the first portion  of the duodenum. Impression:               - Esophageal plaques were found, suspicious for                            candidiasis. Biopsied.                           - Low-grade of narrowing Schatzki ring.                           - LA Grade A reflux esophagitis.                           - Small hiatal hernia.                           - Gastritis. Biopsied.                            - Non-bleeding duodenal ulcers with no stigmata of                            bleeding.                           - Duodenitis. Moderate Sedation:      Moderate (conscious) sedation was personally administered by an       anesthesia professional. The following parameters were monitored: oxygen       saturation, heart rate, blood pressure, and response to care. Recommendation:           - Return patient to hospital ward for ongoing care.                           - Soft diet.                           - Continue present medications.                           - Await pathology results.                           - Perform CT scan (computed tomography) of the                            abdomen with contrast today. Procedure Code(s):        --- Professional ---                           [phone], Esophagogastroduodenoscopy, flexible,                            transoral; with biopsy, single or multiple Diagnosis Code(s):        --- Professional ---                           K22.9, Disease  of esophagus, unspecified                           K22.2, Esophageal obstruction                           K21.00, Gastro-esophageal reflux disease with                            esophagitis, without bleeding                           K44.9, Diaphragmatic hernia without obstruction or                            gangrene                           K29.70, Gastritis, unspecified, without bleeding                           K26.9, Duodenal ulcer, unspecified as acute or                            chronic, without hemorrhage or perforation                           K29.80, Duodenitis without bleeding                           K92.1, Melena (includes Hematochezia) CPT copyright 2022 American Medical Association. All rights reserved. The codes documented in this report are preliminary and upon coder review may  be revised to meet current compliance requirements. Layla Lah, MD Layla Lah,  MD 09/19/2024 11:29:52 AM Number of Addenda: 0

## 2024-09-19 NOTE — Anesthesia Postprocedure Evaluation (Signed)
 Anesthesia Post Note  Patient: Alice White  Procedure(s) Performed: EGD (ESOPHAGOGASTRODUODENOSCOPY)     Patient location during evaluation: Endoscopy Anesthesia Type: MAC Level of consciousness: awake Pain management: pain level controlled Vital Signs Assessment: post-procedure vital signs reviewed and stable Respiratory status: spontaneous breathing Cardiovascular status: blood pressure returned to baseline Postop Assessment: no apparent nausea or vomiting Anesthetic complications: no   No notable events documented.                Lauraine KATHEE [place]

## 2024-09-19 NOTE — Brief Op Note (Signed)
 09/19/2024  11:30 AM  PATIENT:  Alice White  73 y.o. female  PRE-OPERATIVE DIAGNOSIS:  Upper GI bleed  POST-OPERATIVE DIAGNOSIS:  duodenal ulcers, gastritis  PROCEDURE:  Procedure(s): EGD (ESOPHAGOGASTRODUODENOSCOPY) (N/A)  SURGEON:  Surgeons and Role:    * Emina Ribaudo, MD - Primary  Findings ----------- - EGD showed 2 small clean-based duodenal ulcers and moderate to severe duodenitis.  Also showed mild gastritis and mild esophagitis.  Biopsies taken.  Recommendations -------------------------- - Recommend CT abdomen pelvis with IV contrast to rule out any underlying pancreatitis as she has a severe duodenitis and elevated lipase.  Elevated lipase could be from ulcer but it is trending up.  -If CT scan negative for pancreatitis, okay to discharge later today from GI standpoint on Protonix 40 mg twice a day for 4 weeks followed by Protonix 40 mg once a day for another 4 weeks.  -Okay to have diet after CT scan.  Layla Lah MD, FACP 09/19/2024, 11:33 AM  Contact #  [phone]

## 2024-09-19 NOTE — Anesthesia Preprocedure Evaluation (Addendum)
 Anesthesia Evaluation  Patient identified by MRN, date of birth, ID band Patient awake    Reviewed: Allergy & Precautions, NPO status , Patient's Chart, lab work & pertinent test results, reviewed documented beta blocker date and time   History of Anesthesia Complications Negative for: history of anesthetic complications  Airway Mallampati: II  TM Distance: >3 FB Neck ROM: Full    Dental  (+) Teeth Intact, Dental Advisory Given   Pulmonary Current Smoker   breath sounds clear to auscultation       Cardiovascular hypertension, + CAD and + Peripheral Vascular Disease   Rhythm:Regular Rate:Normal  TTE 2024: 1. Left ventricular ejection fraction, by estimation, is 60 to 65%. The  left ventricle has normal function. The left ventricle has no regional  wall motion abnormalities. There is mild concentric left ventricular  hypertrophy. Left ventricular diastolic  parameters are consistent with Grade I diastolic dysfunction (impaired  relaxation).   2. Right ventricular systolic function is normal. The right ventricular  size is normal.   3. Left atrial size was mildly dilated.   4. The mitral valve is normal in structure. Trivial mitral valve  regurgitation. No evidence of mitral stenosis.   5. The aortic valve is tricuspid. There is moderate calcification of the  aortic valve. Aortic valve regurgitation is mild. Aortic valve  sclerosis/calcification is present, without any evidence of aortic  stenosis.   6. Aortic dilatation noted. There is borderline dilatation of the  ascending aorta, measuring 39 mm.   7. The inferior vena cava is normal in size with greater than 50%  respiratory variability, suggesting right atrial pressure of 3 mmHg.     Neuro/Psych    GI/Hepatic   Endo/Other    Renal/GU      Musculoskeletal   Abdominal   Peds  Hematology  (+) Blood dyscrasia, anemia   Anesthesia Other Findings    Reproductive/Obstetrics                              Anesthesia Physical Anesthesia Plan  ASA: 3  Anesthesia Plan: MAC   Post-op Pain Management:    Induction: Intravenous  PONV Risk Score and Plan: 1 and Propofol infusion  Airway Management Planned: Simple Face Mask  Additional Equipment:   Intra-op Plan:   Post-operative Plan:   Informed Consent:      Dental advisory given  Plan Discussed with: CRNA and Surgeon  Anesthesia Plan Comments:          Anesthesia Quick Evaluation

## 2024-09-19 NOTE — Plan of Care (Incomplete)
  Problem: Health Behavior/Discharge Planning: Goal: Ability to manage health-related needs will improve Outcome: Progressing   Problem: Clinical Measurements: Goal: Ability to maintain clinical measurements within normal limits will improve Outcome: Progressing Goal: Diagnostic test results will improve Outcome: Progressing   Problem: Education: Goal: Knowledge of General Education information will improve Description: Including pain rating scale, medication(s)/side effects and non-pharmacologic comfort measures Outcome: Adequate for Discharge   Problem: Clinical Measurements: Goal: Will remain free from infection Outcome: Adequate for Discharge Goal: Respiratory complications will improve Outcome: Adequate for Discharge Goal: Cardiovascular complication will be avoided Outcome: Adequate for Discharge   Problem: Activity: Goal: Risk for activity intolerance will decrease Outcome: Adequate for Discharge   Problem: Nutrition: Goal: Adequate nutrition will be maintained Outcome: Adequate for Discharge   Problem: Coping: Goal: Level of anxiety will decrease Outcome: Adequate for Discharge   Problem: Elimination: Goal: Will not experience complications related to bowel motility Outcome: Adequate for Discharge Goal: Will not experience complications related to urinary retention Outcome: Adequate for Discharge   Problem: Pain Managment: Goal: General experience of comfort will improve and/or be controlled Outcome: Adequate for Discharge   Problem: Safety: Goal: Ability to remain free from injury will improve Outcome: Adequate for Discharge   Problem: Skin Integrity: Goal: Risk for impaired skin integrity will decrease Outcome: Adequate for Discharge

## 2024-09-20 ENCOUNTER — Encounter (HOSPITAL_COMMUNITY): Payer: Self-pay | Admitting: Gastroenterology

## 2024-09-20 DIAGNOSIS — K922 Gastrointestinal hemorrhage, unspecified: Secondary | ICD-10-CM | POA: Diagnosis not present

## 2024-09-20 LAB — CBC
HCT: 27 % — ABNORMAL LOW (ref 36.0–46.0)
Hemoglobin: 8.7 g/dL — ABNORMAL LOW (ref 12.0–15.0)
MCH: 29.7 pg (ref 26.0–34.0)
MCHC: 32.2 g/dL (ref 30.0–36.0)
MCV: 92.2 fL (ref 80.0–100.0)
Platelets: 446 K/uL — ABNORMAL HIGH (ref 150–400)
RBC: 2.93 MIL/uL — ABNORMAL LOW (ref 3.87–5.11)
RDW: 18.4 % — ABNORMAL HIGH (ref 11.5–15.5)
WBC: 8.3 K/uL (ref 4.0–10.5)
nRBC: 0 % (ref 0.0–0.2)

## 2024-09-20 LAB — LIPASE, BLOOD: Lipase: 183 U/L — ABNORMAL HIGH (ref 11–51)

## 2024-09-20 LAB — BASIC METABOLIC PANEL WITH GFR
Anion gap: 12 (ref 5–15)
BUN: 8 mg/dL (ref 8–23)
CO2: 17 mmol/L — ABNORMAL LOW (ref 22–32)
Calcium: 8.4 mg/dL — ABNORMAL LOW (ref 8.9–10.3)
Chloride: 105 mmol/L (ref 98–111)
Creatinine, Ser: 0.73 mg/dL (ref 0.44–1.00)
GFR, Estimated: >60 mL/min (ref >60)
Glucose, Bld: 118 mg/dL — ABNORMAL HIGH (ref 70–99)
Potassium: 3.9 mmol/L (ref 3.5–5.1)
Sodium: 133 mmol/L — ABNORMAL LOW (ref 135–145)

## 2024-09-20 LAB — SURGICAL PATHOLOGY

## 2024-09-20 LAB — MAGNESIUM: Magnesium: 1.9 mg/dL (ref 1.7–2.4)

## 2024-09-20 LAB — PHOSPHORUS: Phosphorus: 2.7 mg/dL (ref 2.5–4.6)

## 2024-09-20 MED ORDER — PANTOPRAZOLE SODIUM 40 MG PO TBEC: DELAYED_RELEASE_TABLET | ORAL | 0 refills | Status: AC

## 2024-09-20 MED ORDER — FLUCONAZOLE 100 MG PO TABS
200.0000 mg | ORAL_TABLET | Freq: Every day | ORAL | Status: DC
  Administered 2024-09-20: 200 mg via ORAL
  Filled 2024-09-20: qty 2

## 2024-09-20 MED ORDER — FLUCONAZOLE 200 MG PO TABS: 200.0000 mg | ORAL_TABLET | Freq: Every day | ORAL | 0 refills | Status: AC

## 2024-09-20 MED ORDER — ASPIRIN 81 MG PO CHEW: 81.0000 mg | CHEWABLE_TABLET | Freq: Every day | ORAL | 0 refills | Status: AC

## 2024-09-20 NOTE — Discharge Summary (Signed)
 Physician Discharge Summary  Alice White DOB: 08-12-1951 DOA: 09/17/2024  PCP: Gladystine Erminio CROME, MD  Admit date: 09/17/2024 Discharge date: 09/20/2024  Admitted From: Home Disposition: Home  Recommendations for Outpatient Follow-up:  Follow up with PCP in 1-2 weeks Follow-up with GI as scheduled  Home Health: None Equipment/Devices: None  Discharge Condition: Stable CODE STATUS: Full Diet recommendation: Low-salt low-fat low-carb diet  Brief/Interim Summary: Alice White is a 73 y.o. female with past medical history significant for HTN, HLD, CAD, AAA, thrombocytosis, anxiety, bilateral hearing loss, vertigo, tobacco use disorder who presented to [facility] ED on 09/17/2024 with complaints of generalized weakness.  Patient reports history of dizziness, nausea, and vomiting a week ago which she attributed to vertigo.  Also at that time she had black-colored stools/diarrhea for about 2 days.  No hematemesis.  Nausea, vomiting, and diarrhea resolved but her appetite remained poor.  She did not have another bowel movement until yesterday which was no longer black in color but still dark brown.  She denies having any abdominal pain since the onset of her symptoms a week ago.  Reporting generalized weakness/fatigue.  Denies history of pancreatitis or prior GI bleed.  She never had an EGD done in the past.  Her last colonoscopy was in February 2023 at [facility] and she was told that she will need another one done in 5 years due to having polyps.  Denies alcohol use.  She takes aspirin 81 mg daily but not on anticoagulation.  Denies regular NSAID use.  She smokes 10 to 15 cigarettes a day.  Patient admitted as above with suspected acute upper GI bleed with symptomatic acute blood loss anemia.  Patient initially anemic to a level requiring transfusion to maintain hemoglobin greater than 7.  GI following, endoscopy showed 2 small clean-based duodenal ulcers with moderate to  severe duodenitis with mild gastritis and mild esophagitis.  There was also questionable note of Candida like findings on endoscopy.  At this time given patient's improved symptoms, negative CT for acute pancreatitis or other acute inflammatory changes she is stable and agreeable for discharge.  Of note CT does show infrarenal abdominal aortic aneurysm at 4.4 cm unchanged from 2023 as well as severely calcified SMA -vascular surgery contacted and will follow-up outpatient as is appropriate.  Follow-up with PCP in 1 to 2 weeks, follow-up with GI as previously scheduled.  Medication changes as listed below.    Discharge Diagnoses:  Principal Problem:   Upper GI bleed Active Problems:   Acute blood loss anemia   AKI (acute kidney injury)   Elevated lipase   Hypokalemia   Acute upper GI bleed Symptomatic acute blood loss anemia -Endoscopy remarkable for small clean-based duodenal ulcers, likely the source of bleed, appreciate [facility] - Hemoglobin currently stable, CT abdomen pelvis unremarkable for acute findings   Acute renal failure: Resolved -Creatinine back to baseline, resume home medications as below   Elevated lipase, questionably incidental finding CT abdomen pelvis unremarkable for any acute inflammation   Hyponatremia Likely hypovolemic in etiology, resolved with IV fluids, resume home medications including HCTZ   Hypokalemia Mild, likely of no clinical significance, follow repeat labs with PCP currently within normal limits.   Chronic thrombocytosis Seen by outpatient hematology and evaluation in the past came back negative for JAK2, MPL, and CALR.  Thrombocytosis was felt to be reactive and likely related to tobacco abuse.  Platelet count appears to be higher than baseline.   -- Follow-up outpatient as  discussed, encouraged smoking cessation   CAD, history of - Troponin minimally elevated and stable, not consistent with ACS.  Patient is not endorsing chest pain. Tobacco  use disorder - Counseled on need for complete cessation/abstinence.- Nicotine patch recommended Hypertension - Continue home amlodipine and HCTZ  Hyperlipidemia -resume home statin and fenofibrate    Discharge Instructions  Discharge Instructions     Ambulatory Referral for Lung Cancer Scre   Complete by: As directed    Call MD for:  difficulty breathing, headache or visual disturbances   Complete by: As directed    Call MD for:  extreme fatigue   Complete by: As directed    Call MD for:  hives   Complete by: As directed    Call MD for:  persistant dizziness or light-headedness   Complete by: As directed    Call MD for:  persistant nausea and vomiting   Complete by: As directed    Call MD for:  severe uncontrolled pain   Complete by: As directed    Call MD for:  temperature >100.4   Complete by: As directed    Diet - low sodium heart healthy   Complete by: As directed    Discharge instructions   Complete by: As directed    Follow up with GI as scheduled.  Vascular surgery will contact you about follow up for repeat imaging.   Increase activity slowly   Complete by: As directed       Allergies as of 09/20/2024   No Known Allergies      Medication List     TAKE these medications    ALPRAZolam 0.25 MG tablet Commonly known as: XANAX Take 0.25 mg by mouth daily as needed for anxiety.   amLODipine 5 MG tablet Commonly known as: NORVASC Take 5 mg by mouth daily.   aspirin 81 MG chewable tablet Chew 1 tablet (81 mg total) by mouth daily. Start taking on: September 22, 2024 What changed: These instructions start on September 22, 2024. If you are unsure what to do until then, ask your doctor or other care provider.   fenofibrate 160 MG tablet Take 160 mg by mouth daily.   Fish Oil 1000 MG Caps Take 1,000 mg by mouth in the morning and at bedtime.   fluconazole 200 MG tablet Commonly known as: DIFLUCAN Take 1 tablet (200 mg total) by mouth daily. Start taking  on: September 21, 2024   hydrochlorothiazide 25 MG tablet Commonly known as: HYDRODIURIL Take 25 mg by mouth daily.   ibandronate 150 MG tablet Commonly known as: BONIVA Take 150 mg by mouth every 30 (thirty) days.   meclizine 50 MG tablet Commonly known as: ANTIVERT Take 25 mg by mouth 3 (three) times daily as needed for dizziness or nausea.   pantoprazole 40 MG tablet Commonly known as: Protonix Take 1 tablet (40 mg total) by mouth 2 (two) times daily for 28 days, THEN 1 tablet (40 mg total) daily for 28 days. Start taking on: September 20, 2024   rosuvastatin  20 MG tablet Commonly known as: CRESTOR  Take 1 tablet (20 mg total) by mouth daily. What changed: when to take this        No Known Allergies  Consultations: [facility]   Procedures/Studies: CT ABDOMEN W CONTRAST Result Date: 09/19/2024 EXAM: CT ABDOMEN WITH CONTRAST 09/19/2024 04:22:11 PM TECHNIQUE: CT of the abdomen was performed with the administration of 100 mL of iohexol  (OMNIPAQUE ) 300 MG/ML solution. Multiplanar reformatted images are provided  for review. Automated exposure control, iterative reconstruction, and/or weight based adjustment of the mA/kV was utilized to reduce the radiation dose to as low as reasonably achievable. COMPARISON: Comparison with 05/27/2022. CLINICAL HISTORY: Pancreatitis suspected. Elevated lipase. Question acute pancreatitis but vomiting has resolved and no abdominal pain or tenderness. Continue IV fluid hydration and trend lipase. If lipase remains elevated, consider CT imaging abdomen. FINDINGS: LOWER CHEST: Visualized portion of the lower chest demonstrates no acute abnormality. HEPATOBILIARY: Hepatic steatosis. Gallbladder is unremarkable. SPLEEN: Spleen demonstrates no acute abnormality. PANCREAS: No pancreatic ductal dilation. No peri-pancreatic inflammatory fluid or stranding. ADRENAL GLANDS: Adrenal glands demonstrate no acute abnormality. KIDNEYS: No stones in the kidneys or proximal  ureters. No hydronephrosis. No perinephric or periureteral stranding. GI AND BOWEL: Stomach and duodenal sweep demonstrate no acute abnormality. There is no bowel obstruction. No abnormal bowel wall thickening or distension. PERITONEUM AND RETROPERITONEUM: No ascites or free air. Aortic atherosclerotic calcification. There is severe narrowing due to calcification at the origin of the SMA. Infrarenal abdominal aortic aneurysm arising immediately distal to the origins of the bilateral renal arteries measuring 4.4 cm in diameter. This is not substantially changed from 05/27/2022 using a similar measuring technique. LYMPH NODES: No lymphadenopathy. BONES AND SOFT TISSUES: No acute abnormality of the visualized bones. No focal soft tissue abnormality. IMPRESSION: 1. No CT evidence of acute pancreatitis. 2. Infrarenal abdominal aortic aneurysm measuring 4.4 cm, unchanged from 05/27/2022; yearly surveillance imaging recommended per ACR 2013/SVS 2018. 3. 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 Electronically signed by: Norman Gatlin MD 09/19/2024 06:47 PM EDT RP Workstation: [id number]   DG Chest Port 1 View Result Date: 09/17/2024 CLINICAL DATA:  Generalized weakness EXAM: PORTABLE CHEST 1 VIEW COMPARISON:  10/22/2023 CT FINDINGS: Cardiac shadow is within normal limits. Aortic calcifications are noted. Lungs are well aerated bilaterally. No focal infiltrate or effusion is seen. IMPRESSION: No active disease. Electronically Signed   By: Oneil Devonshire M.D.   On: 09/17/2024 20:34     Subjective: No acute issues or events overnight denies nausea vomiting diarrhea constipation headache fevers chills or chest pain   Discharge Exam: Vitals:   09/20/24 0825 09/20/24 1215  BP:  121/71  Pulse:  85  Resp: (!) 23 18  Temp:  98.7 F (37.1 C)  SpO2:  97%   Vitals:   09/19/24 2130 09/20/24 0443 09/20/24 0825 09/20/24 1215  BP:  133/67  121/71  Pulse:  72  85  Resp: 17 (!) 22 (!) 23 18  Temp:  98.7 F  (37.1 C)  98.7 F (37.1 C)  TempSrc:  Oral  Oral  SpO2:  97%  97%  Weight:      Height:        General: Pt is alert, awake, not in acute distress Cardiovascular: RRR, S1/S2 +, no rubs, no gallops Respiratory: CTA bilaterally, no wheezing, no rhonchi Abdominal: Soft, NT, ND, bowel sounds + Extremities: no edema, no cyanosis    The results of significant diagnostics from this hospitalization (including imaging, microbiology, ancillary and laboratory) are listed below for reference.     Microbiology: No results found for this or any previous visit (from the past 240 hours).   Labs: BNP (last 3 results) No results for input(s): BNP in the last 8760 hours. Basic Metabolic Panel: Recent Labs  Lab 09/17/24 2006 09/18/24 0235 09/19/24 0024 09/20/24 0401  NA 128* 132* 131* 133*  K 3.2* 4.2 3.4* 3.9  CL 92* 101 101 105  CO2 22  20* 18* 17*  GLUCOSE 103* 91 122* 118*  BUN 23 18 14 8   CREATININE 1.26* 1.03* 0.93 0.73  CALCIUM  9.0 8.6* 8.3* 8.4*  MG 2.4  --  1.8 1.9  PHOS  --  2.5  --  2.7   Liver Function Tests: Recent Labs  Lab 09/17/24 2006 09/19/24 0024  AST 22 22  ALT 13 6  ALKPHOS 36* 37*  BILITOT 0.3 0.6  PROT 6.9 5.9*  ALBUMIN 4.2 3.6   Recent Labs  Lab 09/17/24 2006 09/18/24 0235 09/19/24 0024 09/20/24 0401  LIPASE 129* 134* 197* 183*   No results for input(s): AMMONIA in the last 168 hours. CBC: Recent Labs  Lab 09/17/24 2006 09/18/24 0235 09/18/24 1530 09/19/24 0024 09/19/24 0714 09/19/24 1306 09/19/24 1900 09/20/24 0401  WBC 7.3 7.7  --  8.3  --   --   --  8.3  NEUTROABS 5.2  --   --   --   --   --   --   --   HGB 6.0* 5.2*   < > 8.6* 8.7* 8.6* 8.7* 8.7*  HCT 18.1* 17.2*   < > 26.9* 27.7* 26.8* 27.4* 27.0*  MCV 95.3 99.4  --  91.8  --   --   --  92.2  PLT 581* 495*  --  466*  --   --   --  446*   < > = values in this interval not displayed.   Cardiac Enzymes: Recent Labs  Lab 09/17/24 2006  CKTOTAL 69   BNP: Invalid  input(s): POCBNP CBG: No results for input(s): GLUCAP in the last 168 hours. D-Dimer No results for input(s): DDIMER in the last 72 hours. Hgb A1c No results for input(s): HGBA1C in the last 72 hours. Lipid Profile No results for input(s): CHOL, HDL, LDLCALC, TRIG, CHOLHDL, LDLDIRECT in the last 72 hours. Thyroid function studies Recent Labs    09/17/24 2006  TSH 2.327   Anemia work up Recent Labs    09/17/24 2041  VITAMINB12 1,397*  FOLATE >20.0  FERRITIN 44  TIBC 413  IRON 21*  RETICCTPCT 18.8*   Urinalysis    Component Value Date/Time   COLORURINE YELLOW 09/17/2024 2250   APPEARANCEUR CLEAR 09/17/2024 2250   LABSPEC 1.010 09/17/2024 2250   PHURINE 7.0 09/17/2024 2250   GLUCOSEU NEGATIVE 09/17/2024 2250   HGBUR NEGATIVE 09/17/2024 2250   BILIRUBINUR NEGATIVE 09/17/2024 2250   KETONESUR NEGATIVE 09/17/2024 2250   PROTEINUR NEGATIVE 09/17/2024 2250   NITRITE NEGATIVE 09/17/2024 2250   LEUKOCYTESUR NEGATIVE 09/17/2024 2250   Sepsis Labs Recent Labs  Lab 09/17/24 2006 09/18/24 0235 09/19/24 0024 09/20/24 0401  WBC 7.3 7.7 8.3 8.3   Microbiology No results found for this or any previous visit (from the past 240 hours).   Time coordinating discharge: Over 30 minutes  SIGNED:   Elsie JAYSON Mont[place], DO [facility] 09/20/2024, 5:39 PM Pager   If 7PM-7AM, please contact night-coverage [email or web address]

## 2024-09-20 NOTE — Progress Notes (Addendum)
 [facility] Gastroenterology Progress Note  DONYELLE ENYEART 73 y.o. May 26, 1951  CC: Melena, elevated lipase   Subjective: Patient seen and examined at bedside.  Feeling better.  Denies any further bleeding.  Denies abdominal pain.  ROS : Febrile, negative for chest pain and shortness of breath   Objective: Vital signs in last 24 hours: Vitals:   09/20/24 0443 09/20/24 0825  BP: 133/67   Pulse: 72   Resp: (!) 22 (!) 23  Temp: 98.7 F (37.1 C)   SpO2: 97%     Physical Exam:  General:  Alert, cooperative, no distress, appears stated age  Head:  Normocephalic, without obvious abnormality, atraumatic  Eyes:  , EOM's intact,   Lungs:   No visible respiratory distress  Heart:  Regular rate and rhythm, S1, S2 normal  Abdomen:   Soft, non-tender, bowel sounds active all four quadrants,  no masses,   Extremities: Extremities normal, atraumatic, no  edema  Pulses: 2+ and symmetric    Lab Results: Recent Labs    09/18/24 0235 09/19/24 0024 09/20/24 0401  NA 132* 131* 133*  K 4.2 3.4* 3.9  CL 101 101 105  CO2 20* 18* 17*  GLUCOSE 91 122* 118*  BUN 18 14 8   CREATININE 1.03* 0.93 0.73  CALCIUM  8.6* 8.3* 8.4*  MG  --  1.8 1.9  PHOS 2.5  --  2.7   Recent Labs    09/17/24 2006 09/19/24 0024  AST 22 22  ALT 13 6  ALKPHOS 36* 37*  BILITOT 0.3 0.6  PROT 6.9 5.9*  ALBUMIN 4.2 3.6   Recent Labs    09/17/24 2006 09/18/24 0235 09/19/24 0024 09/19/24 0714 09/19/24 1900 09/20/24 0401  WBC 7.3   < > 8.3  --   --  8.3  NEUTROABS 5.2  --   --   --   --   --   HGB 6.0*   < > 8.6*   < > 8.7* 8.7*  HCT 18.1*   < > 26.9*   < > 27.4* 27.0*  MCV 95.3   < > 91.8  --   --  92.2  PLT 581*   < > 466*  --   --  446*   < > = values in this interval not displayed.   Recent Labs    09/18/24 0235  LABPROT 15.5*  INR 1.2      Assessment/Plan: -Melena.  EGD yesterday showed duodenal ulcers and duodenitis. - Elevated lipase.  CT abdomen pelvis with IV contrast negative for any acute  changes.  Negative for pancreatitis.  Recommendations ---------------------------  - Recommended discharge home on Protonix 40 mg twice a day for 4 weeks followed by Protonix 40 mg once a day. - Avoid NSAIDs.  Avoid smoking. - Okay to discharge from GI standpoint.  GI will sign off.  Call us  back if needed  - Recommend outpatient vascular surgery referral for severe calcific narrowing at the origin of SMA.  Layla Lah MD, FACP 09/20/2024, 12:14 PM  Contact #  [phone]

## 2024-09-20 NOTE — Plan of Care (Signed)

## 2024-09-20 NOTE — Progress Notes (Signed)
 Discharge instructions gone over with patient, including new medication, medication changes, signs and symptoms to look out for, and future medical appointments.Patient verbalized understanding of instructions. Patient transported off unit by NT via wheelchair.

## 2024-09-28 ENCOUNTER — Telehealth: Payer: Self-pay | Admitting: Surgery

## 2024-11-06 ENCOUNTER — Other Ambulatory Visit: Payer: Self-pay

## 2024-11-06 DIAGNOSIS — Z87891 Personal history of nicotine dependence: Secondary | ICD-10-CM

## 2024-11-06 DIAGNOSIS — Z122 Encounter for screening for malignant neoplasm of respiratory organs: Secondary | ICD-10-CM

## 2024-11-06 DIAGNOSIS — F17210 Nicotine dependence, cigarettes, uncomplicated: Secondary | ICD-10-CM

## 2024-11-16 ENCOUNTER — Ambulatory Visit (HOSPITAL_COMMUNITY): Admission: RE | Admit: 2024-11-16 | Discharge: 2024-11-16 | Attending: Internal Medicine

## 2024-11-16 DIAGNOSIS — I10 Essential (primary) hypertension: Secondary | ICD-10-CM

## 2024-11-16 DIAGNOSIS — F17200 Nicotine dependence, unspecified, uncomplicated: Secondary | ICD-10-CM | POA: Diagnosis not present

## 2024-11-16 DIAGNOSIS — I2510 Atherosclerotic heart disease of native coronary artery without angina pectoris: Secondary | ICD-10-CM | POA: Diagnosis not present

## 2024-11-16 DIAGNOSIS — Z8249 Family history of ischemic heart disease and other diseases of the circulatory system: Secondary | ICD-10-CM | POA: Diagnosis not present

## 2024-11-16 DIAGNOSIS — E785 Hyperlipidemia, unspecified: Secondary | ICD-10-CM | POA: Diagnosis not present

## 2024-11-16 DIAGNOSIS — I371 Nonrheumatic pulmonary valve insufficiency: Secondary | ICD-10-CM | POA: Insufficient documentation

## 2024-11-16 DIAGNOSIS — I358 Other nonrheumatic aortic valve disorders: Secondary | ICD-10-CM | POA: Diagnosis not present

## 2024-11-16 DIAGNOSIS — I77810 Thoracic aortic ectasia: Secondary | ICD-10-CM | POA: Insufficient documentation

## 2024-11-16 LAB — ECHOCARDIOGRAM COMPLETE
Area-P 1/2: 3.63 cm2
S' Lateral: 1.8 cm

## 2024-11-22 ENCOUNTER — Encounter: Payer: Self-pay | Admitting: Cardiology

## 2024-11-22 ENCOUNTER — Ambulatory Visit: Attending: Cardiovascular Disease | Admitting: Cardiology

## 2024-11-22 VITALS — BP 130/76 | HR 83 | Ht 63.0 in | Wt 117.4 lb

## 2024-11-22 DIAGNOSIS — I714 Abdominal aortic aneurysm, without rupture, unspecified: Secondary | ICD-10-CM | POA: Diagnosis not present

## 2024-11-22 DIAGNOSIS — I2510 Atherosclerotic heart disease of native coronary artery without angina pectoris: Secondary | ICD-10-CM | POA: Diagnosis not present

## 2024-11-22 NOTE — Progress Notes (Signed)
 Cardiology Office Note:  .   Date:  11/22/2024  ID:  Alice White, DOB 09-09-1951, MRN [id number] PCP: Gladystine Erminio CROME, MD  [facility] Providers Cardiologist:  Newman Lawrence, MD PCP: Gladystine Erminio CROME, MD  Chief Complaint  Patient presents with   Coronary Artery Disease     Alice White is a 73 y.o. female with hypertension, multivessel coronary calcification, including in left main, infra renal AAA, nicotine  dependence  History of Present Illness  Patient was hospitalized in 09/2024 with upper GI bleed, endoscopy showing 2 small clean-based duodenal ulcers with moderate to severe duodenitis with mild gastritis and mild esophagitis.  There was questionable finding of Candida on endoscopy as well.  Infrarenal abdominal aortic aneurysm was stable.  Aspirin  81 mg daily was resumed 2 days later.  She has not had any recurrent bleeding issues.  She suspects that some of her GI issues may have had to do with severe nausea and vomiting and she was having prior to that episode.  He reportedly had repeat hemoglobin check with her PCP and was between 9 and 10.  She has a follow-up with PCP soon.  She has not seen GI since her discharge.    Vitals:   11/22/24 1516  BP: 130/76  Pulse: 83  SpO2: 92%       Review of Systems  Cardiovascular:  Negative for chest pain, dyspnea on exertion, leg swelling, palpitations and syncope.        Studies Reviewed: SABRA        EKG 07/03/2024: Normal sinus rhythm Minimal voltage criteria for LVH, may be normal variant ( Sokolow-Lyon ) Marked ST abnormality, possible inferior subendocardial injury When compared with ECG of 14-Jul-2023 14:46, No significant change was found  Echocardiogram 11/2024:  1. Left ventricular ejection fraction, by estimation, is 65 to 70%. Left  ventricular ejection fraction by 3D volume is 66 %. The left ventricle has  normal function. The left ventricle has no regional wall motion  abnormalities.  There is mild concentric  left ventricular hypertrophy. Left ventricular diastolic parameters are  consistent with Grade I diastolic dysfunction (impaired relaxation).   2. Right ventricular systolic function is normal. The right ventricular  size is normal.   3. The mitral valve is normal in structure. No evidence of mitral valve  regurgitation. No evidence of mitral stenosis.   4. The aortic valve is normal in structure. Aortic valve regurgitation is  not visualized. Aortic valve sclerosis/calcification is present, without  any evidence of aortic stenosis.   5. Pulmonic valve regurgitation is moderate.   6. The inferior vena cava is normal in size with greater than 50%  respiratory variability, suggesting right atrial pressure of 3 mmHg.   Comparison(s): No significant change from prior study.   CT abdomen 09/2024: 1. No CT evidence of acute pancreatitis. 2. Infrarenal abdominal aortic aneurysm measuring 4.4 cm, unchanged from 05/27/2022; yearly surveillance imaging recommended per ACR 2013/SVS 2018. 3. 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    Vascular US  12/2023: Abdominal Aorta:  - AAA (mid) with the largest diameter of 4.7 cm (Prior: 4.8 cm)  - Bilateral common iliac arteries are patent with no evidence of stenosis.  - Right CIA: 0.8 cm.  - Left CIA: 0.8 cm.      CT chest 10/2023: 1. Lung-RADS 2, benign appearance or behavior. Continue annual screening with low-dose chest CT without contrast in 12 months. 2. 1.5 cm hyperdense right thyroid nodule. Recommend  thyroid ultrasound. (Ref: J Am Coll Radiol. 2015 Feb;12(2): 143-50). 3. Partially imaged infrarenal aortic aneurysm measures at least 4.1 cm, last imaged on 05/27/2022 CT angiography of the abdomen and pelvis. 4. Aortic atherosclerosis (ICD10-I70.0). Coronary artery calcification. 5.  Emphysema (ICD10-J43.9).    CT cardiac scoring 05/2023: 1. Aortic Atherosclerosis (ICD10-I70.0) and Emphysema  (ICD10-J43.9). 2. Nodular areas within the trachea and central bronchi are likely represent retained secretions, however are indeterminate. 3. Recommend continued annual lung cancer screening CT for follow-up of prior pulmonary nodules.   Coronary Calcium  Score: Left main: 79 Left anterior descending artery: 197 Left circumflex artery: 52 Right coronary artery: 0   Total: 328 Percentile: 82   Pericardium: Normal. Aorta: Normal caliber of ascending aorta. Aortic atherosclerosis noted.   Labs 09/2024: Hb 8.7 Cr 0.73, Na 133 TSH 2.3  12/2023: Chol 123, TG 81, HDL 43, LDL 64   Labs 08/2023-12/2023: Chol 123, TG 81, HDL 43, LDL 64 Cr 0.79   Physical Exam Vitals and nursing note reviewed.  Constitutional:      General: She is not in acute distress. Neck:     Vascular: No JVD.  Cardiovascular:     Rate and Rhythm: Normal rate and regular rhythm.     Heart sounds: Normal heart sounds. No murmur heard. Pulmonary:     Effort: Pulmonary effort is normal.     Breath sounds: Normal breath sounds. No wheezing or rales.  Musculoskeletal:     Right lower leg: No edema.     Left lower leg: No edema.      VISIT DIAGNOSES:   ICD-10-CM   1. Coronary artery disease involving native coronary artery of native heart without angina pectoris  I25.10     2. Abdominal aortic aneurysm (AAA) without rupture, unspecified part  I71.40         Alice White is a 73 y.o. female with  hypertension, multivessel coronary calcification, including in left main, infra renal AAA, nicotine  dependence Assessment & Plan  Coronary calcification: Multivessel coronary calcification including left main.  Given left main calcification, I recommended aspirin  81 mg daily.  This was temporarily held after her GI bleeding in 09/2024, but resumed thereafter with no recurrence of bleeding.  Recommend follow-up with PCP to monitor hemoglobin regularly, as well as seeing gastroenterology outpatient.  She was  previously seeing a gastroenterologist for colonoscopy and cholesterol, but upper endoscopy was performed by Dr. Elicia in the hospital.  I defer to PCP regarding GI referral.   AAA: Stable infrarenal AAA, annual follow-up with Dr. Magda.  Dilated ascending aorta: Not appreciated on echocardiogram 11/2024.  Nicotine  dependence: Recommend smoking cessation.    F/u in 1 year  Signed, Newman JINNY Lawrence, MD

## 2024-11-22 NOTE — Patient Instructions (Signed)
 Medication Instructions:  None *If you need a refill on your cardiac medications before your next appointment, please call your pharmacy*  Lab Work: None If you have labs (blood work) drawn today and your tests are completely normal, you will receive your results only by: MyChart Message (if you have MyChart) OR A paper copy in the mail If you have any lab test that is abnormal or we need to change your treatment, we will call you to review the results.  Testing/Procedures: None  Follow-Up: At [facility] [place], you and your health needs are our priority.  As part of our continuing mission to provide you with exceptional heart care, our providers are all part of one team.  This team includes your primary Cardiologist (physician) and Advanced Practice Providers or APPs (Physician Assistants and Nurse Practitioners) who all work together to provide you with the care you need, when you need it.  Your next appointment:   1 year(s)  Provider:   Newman JINNY Lawrence, MD  We recommend signing up for the patient portal called MyChart.  Sign up information is provided on this After Visit Summary.  MyChart is used to connect with patients for Virtual Visits (Telemedicine).  Patients are able to view lab/test results, encounter notes, upcoming appointments, etc.  Non-urgent messages can be sent to your provider as well.   To learn more about what you can do with MyChart, go to [email or web address].

## 2024-12-14 NOTE — Telephone Encounter (Signed)
 Unable to schedule
